# Patient Record
Sex: Female | Born: 1995 | Race: Black or African American | Hispanic: No | Marital: Single | Smoking: Never smoker
Health system: Southern US, Community
[De-identification: ages and names within clinical notes are randomized; demographics above are authoritative.]

## PROBLEM LIST (undated history)

## (undated) DIAGNOSIS — E669 Obesity, unspecified: Secondary | ICD-10-CM

## (undated) DIAGNOSIS — F419 Anxiety disorder, unspecified: Secondary | ICD-10-CM

## (undated) DIAGNOSIS — F988 Other specified behavioral and emotional disorders with onset usually occurring in childhood and adolescence: Secondary | ICD-10-CM

## (undated) HISTORY — DX: Anxiety disorder, unspecified: F41.9

## (undated) HISTORY — PX: INGUINAL HERNIA REPAIR: SUR1180

## (undated) HISTORY — DX: Obesity, unspecified: E66.9

## (undated) HISTORY — DX: Other specified behavioral and emotional disorders with onset usually occurring in childhood and adolescence: F98.8

---
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
## 2017-01-29 ENCOUNTER — Ambulatory Visit: Payer: BC Managed Care – PPO | Admitting: Family Medicine

## 2017-01-29 ENCOUNTER — Ambulatory Visit (INDEPENDENT_AMBULATORY_CARE_PROVIDER_SITE_OTHER): Payer: BC Managed Care – PPO | Admitting: Family Medicine

## 2017-01-29 ENCOUNTER — Encounter: Payer: Self-pay | Admitting: Family Medicine

## 2017-01-29 VITALS — BP 120/80 | HR 68 | Temp 98.5°F | Wt 212.2 lb

## 2017-01-29 DIAGNOSIS — M25511 Pain in right shoulder: Secondary | ICD-10-CM
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
## 2017-01-29 NOTE — Patient Instructions (Signed)
Continue taking naproxen twice daily with food. Use ice or heat. Rest your shoulder. Follow up with the orthopedist as discussed.

## 2017-07-22 ENCOUNTER — Encounter: Payer: Self-pay | Admitting: Family Medicine

## 2017-07-22 ENCOUNTER — Ambulatory Visit
Admission: RE | Admit: 2017-07-22 | Discharge: 2017-07-22 | Disposition: A | Discharge status: Home / Self Care | Payer: BC Managed Care – PPO | Source: Ambulatory Visit | Attending: Family Medicine | Admitting: Family Medicine

## 2017-07-22 ENCOUNTER — Ambulatory Visit: Payer: BC Managed Care – PPO | Admitting: Family Medicine

## 2017-07-22 VITALS — BP 120/70 | HR 86 | Ht 63.25 in | Wt 216.2 lb

## 2017-07-22 DIAGNOSIS — M79672 Pain in left foot: Secondary | ICD-10-CM | POA: Diagnosis not present

## 2017-07-22 DIAGNOSIS — M25572 Pain in left ankle and joints of left foot: Secondary | ICD-10-CM

## 2017-07-22 NOTE — Patient Instructions (Signed)
We will call you with your XR results. Take Ibuprofen 600 mg every 8 hours for the next 3-4 days, ice or heat your ankle and elevate as possible. You may use an ace wrap for compression if this improves your pain.     RICE for Routine Care of Injuries Many injuries can be cared for using rest, ice, compression, and elevation (RICE therapy). Using RICE therapy can help to lessen pain and swelling. It can help your body to heal. Rest Reduce your normal activities and avoid using the injured part of your body. You can go back to your normal activities when you feel okay and your doctor says it is okay. Ice Do not put ice on your bare skin.  Put ice in a plastic bag.  Place a towel between your skin and the bag.  Leave the ice on for 20 minutes, 2-3 times a day.  Do this for as long as told by your doctor. Compression Compression means putting pressure on the injured area. This can be done with an elastic bandage. If an elastic bandage has been applied:  Remove and reapply the bandage every 3-4 hours or as told by your doctor.  Make sure the bandage is not wrapped too tight. Wrap the bandage more loosely if part of your body beyond the bandage is blue, swollen, cold, painful, or loses feeling (numb).  See your doctor if the bandage seems to make your problems worse.  Elevation Elevation means keeping the injured area raised. Raise the injured area above your heart or the center of your chest if you can. When should I get help? You should get help if:  You keep having pain and swelling.  Your symptoms get worse.  Get help right away if: You should get help right away if:  You have sudden bad pain at or below the area of your injury.  You have redness or more swelling around your injury.  You have tingling or numbness at or below the injury that does not go away when you take off the bandage.  This information is not intended to replace advice given to you by your health care  provider. Make sure you discuss any questions you have with your health care provider. Document Released: 09/09/2007 Document Revised: 02/18/2016 Document Reviewed: 02/28/2014 Elsevier Interactive Patient Education  2017 [employer]

## 2017-07-22 NOTE — Progress Notes (Signed)
   Subjective:    Patient ID: Aimee Duncan, female    DOB: [birth year], 22 y.o.   MRN: [id number]  HPI Chief Complaint  Patient presents with  . left foot pain    left foot pain. about a week ago. walking when she hurt it   She is here with complaints of a 9 day history of left ankle pain that radiates down into the lateral aspect of her foot and occasionally up into her left lower leg. States she has been walking more at school and had to recently walk up a long hill but denies injury.   Pain improves with rest. Pain is worse with ambulation. Has taken ibuprofen sporadically. No ice or heat. Swelling improves with elevation.  No numbness, tingling or weakness. Denies fever, chills, N/V/D, no other arthralgias or myalgias.   Reviewed allergies, medications, past medical, surgical, family, and social history.   Review of Systems Pertinent positives and negatives in the history of present illness.     Objective:   Physical Exam BP 120/70   Pulse 86   Ht 5' 3.25" (1.607 m)   Wt 216 lb 3.2 oz (98.1 kg)   BMI 38.00 kg/m  Mild edema to her lateral left ankle. TTP around lateral malleolus but not directly over the malleoli. Dorsal aspect of left foot sightly TTP. Normal sensation, cap refill and motor function. Skin warm and dry. Slightly antalgic gait.        Assessment & Plan:  Acute left ankle pain - Plan: DG Ankle Complete Left  Left foot pain - Plan: DG Foot Complete Left  Will send for XR and have her try RICE. She may take ibuprofen. Follow up pending XR.

## 2019-01-05 ENCOUNTER — Ambulatory Visit: Payer: BC Managed Care – PPO | Admitting: Medical

## 2019-01-05 ENCOUNTER — Encounter: Payer: Self-pay | Admitting: Medical

## 2019-01-05 ENCOUNTER — Other Ambulatory Visit: Payer: Self-pay

## 2019-01-05 VITALS — BP 110/72 | HR 88 | Temp 98.4°F | Ht 64.0 in | Wt 220.2 lb

## 2019-01-05 DIAGNOSIS — Z23 Encounter for immunization: Secondary | ICD-10-CM | POA: Diagnosis not present

## 2019-01-05 DIAGNOSIS — L309 Dermatitis, unspecified: Secondary | ICD-10-CM | POA: Diagnosis not present

## 2019-01-05 HISTORY — DX: Encounter for immunization: Z23

## 2019-01-05 MED ORDER — HYDROCORTISONE VALERATE 0.2 % EX OINT: 1.0000 "application " | TOPICAL_OINTMENT | Freq: Two times a day (BID) | CUTANEOUS | 1 refills | Status: AC

## 2019-01-05 MED ORDER — FEXOFENADINE HCL 180 MG PO TABS: 180.0000 mg | ORAL_TABLET | Freq: Every day | ORAL | 3 refills | Status: DC

## 2019-01-05 MED ORDER — CETAPHIL MOISTURIZING EX LOTN: 1.0000 "application " | TOPICAL_LOTION | CUTANEOUS | 11 refills | Status: DC | PRN

## 2019-01-05 NOTE — Progress Notes (Signed)
Subjective: Chief Complaint  Patient presents with  . Eczema    chest area    Here for eczema concern.  Been using a few different lotions and recently cortisone cream OTC for eczema flare.  Gets eczema on arms, chest, and is flared up currently x a month.  Has been under more stress of late, thinks this is contributing as well.  Has used steroid cream in the past by me that helped.  Not using daily lotion.  No other aggravating or relieving factors. No other complaint.  Past Medical History:  Diagnosis Date  . Obesity   . Premature birth    born to mother with eclampsia, born [redacted] wk gestation   Current Outpatient Medications on File Prior to Visit  Medication Sig Dispense Refill  . SPRINTEC 28 0.25-35 MG-MCG tablet Take 1 tablet by mouth daily.  2   No current facility-administered medications on file prior to visit.    Objective: BP 110/72   Pulse 88   Temp 98.4 F (36.9 C)   Ht 5\' 4"  (1.626 m)   Wt 220 lb 3.2 oz (99.9 kg)   SpO2 98%   BMI 37.80 kg/m   Gen: wd, wn, nad Numerous rough brown patches on chest, forearms, antecubital regions and dorsal hands Exam chaperoned by nurse      Assessment: Encounter Diagnoses  Name Primary?  . Eczema, unspecified type Yes  . Need for influenza vaccination       Plan Eczema   For flareup such as current flareup, use the Hydrocortisone prescription ointment for the next 1- 2 weeks.   As symptoms improve, then go back to daily preventative treatment  Daily prevention of flares, use the following:  Take Allegra allergy pill daily at bedtime  Use Cetaphil moisturizing lotion daily  Avoid really hot showers   Eat a healthy low fat, low sugar diet  Exercise regularly  Avoid high sugar/junk foods  If this doesn't seem to be helping in the next 30 days, recheck   Counseled on the influenza virus vaccine.  Vaccine information sheet given.  Influenza vaccine given after consent obtained.    Aimee Duncan was seen today  for eczema.  Diagnoses and all orders for this visit:  Eczema, unspecified type  Need for influenza vaccination  Other orders -     hydrocortisone valerate ointment (WESTCORT) 0.2 %; Apply 1 application topically 2 (two) times daily. -     cetaphil (CETAPHIL) lotion; Apply 1 application topically as needed for dry skin. -     fexofenadine (ALLEGRA ALLERGY) 180 MG tablet; Take 1 tablet (180 mg total) by mouth daily.

## 2019-01-05 NOTE — Patient Instructions (Signed)
Eczema   For flareup such as current flareup, use the Hydrocortisone prescription ointment for the next 1- 2 weeks.   As symptoms improve, then go back to daily preventative treatment  Daily prevention of flares, use the following:  Take Allegra allergy pill daily at bedtime  Use Cetaphil moisturizing lotion daily  Avoid really hot showers   Eat a healthy low fat, low sugar diet  Exercise regularly  Avoid high sugar/junk foods  If this doesn't seem to be helping in the next 30 days, recheck     Eczema Eczema is a broad term for a group of skin conditions that cause skin to become rough and inflamed. Each type of eczema has different triggers, symptoms, and treatments. Eczema of any type is usually itchy and symptoms range from mild to severe. Eczema and its symptoms are not spread from person to person (are not contagious). It can appear on different parts of the body at different times. Your eczema may not look the same as someone else's eczema. What are the types of eczema? Atopic dermatitis This is a long-term (chronic) skin disease that keeps coming back (recurring). Usual symptoms are dry skin and small, solid pimples that may swell and leak fluid (weep). Contact dermatitis  This happens when something irritates the skin and causes a rash. The irritation can come from substances that you are allergic to (allergens), such as poison ivy, chemicals, or medicines that were applied to your skin. Dyshidrotic eczema This is a form of eczema on the hands and feet. It shows up as very itchy, fluid-filled blisters. It can affect people of any age, but is more common before age 81. Hand eczema  This causes very itchy areas of skin on the palms and sides of the hands and fingers. This type of eczema is common in industrial jobs where you may be exposed to many different types of irritants. Lichen simplex chronicus This type of eczema occurs when a person constantly scratches one area  of the body. Repeated scratching of the area leads to thickened skin (lichenification). Lichen simplex chronicus can occur along with other types of eczema. It is more common in adults, but may be seen in children as well. Nummular eczema This is a common type of eczema. It has no known cause. It typically causes a red, circular, crusty lesion (plaque) that may be itchy. Scratching may become a habit and can cause bleeding. Nummular eczema occurs most often in people of middle-age or older. It most often affects the hands. Seborrheic dermatitis This is a common skin disease that mainly affects the scalp. It may also affect any oily areas of the body, such as the face, sides of nose, eyebrows, ears, eyelids, and chest. It is marked by small scaling and redness of the skin (erythema). This can affect people of all ages. In infants, this condition is known as Location manager." Stasis dermatitis This is a common skin disease that usually appears on the legs and feet. It most often occurs in people who have a condition that prevents blood from being pumped through the veins in the legs (chronic venous insufficiency). Stasis dermatitis is a chronic condition that needs long-term management. How is eczema diagnosed? Your health care provider will examine your skin and review your medical history. He or she may also give you skin patch tests. These tests involve taking patches that contain possible allergens and placing them on your back. He or she will then check in a few days to  see if an allergic reaction occurred. What are the common treatments? Treatment for eczema is based on the type of eczema you have. Hydrocortisone steroid medicine can relieve itching quickly and help reduce inflammation. This medicine may be prescribed or obtained over-the-counter, depending on the strength of the medicine that is needed. Follow these instructions at home:  Take over-the-counter and prescription medicines only as told by  your health care provider.  Use creams or ointments to moisturize your skin. Do not use lotions.  Learn what triggers or irritates your symptoms. Avoid these things.  Treat symptom flare-ups quickly.  Do not itch your skin. This can make your rash worse.  Keep all follow-up visits as told by your health care provider. This is important. Where to find more information  The American Academy of Dermatology: http://jones-macias.info/  The National Eczema Association: www.nationaleczema.org Contact a health care provider if:  You have serious itching, even with treatment.  You regularly scratch your skin until it bleeds.  Your rash looks different than usual.  Your skin is painful, swollen, or more red than usual.  You have a fever. Summary  There are eight general types of eczema. Each type has different triggers.  Eczema of any type causes itching that may range from mild to severe.  Treatment varies based on the type of eczema you have. Hydrocortisone steroid medicine can help with itching and inflammation.  Protecting your skin is the best way to prevent eczema. Use moisturizers and lotions. Avoid triggers and irritants, and treat flare-ups quickly. This information is not intended to replace advice given to you by your health care provider. Make sure you discuss any questions you have with your health care provider. Document Released: 08/06/2016 Document Revised: 03/05/2017 Document Reviewed: 08/06/2016 Elsevier Patient Education  2020 [employer].

## 2019-01-06 NOTE — Addendum Note (Signed)
Addended by: Edgar Frisk on: 01/06/2019 12:46 PM   Modules accepted: Orders

## 2019-04-17 ENCOUNTER — Ambulatory Visit: Payer: BC Managed Care – PPO | Admitting: Medical

## 2019-04-17 ENCOUNTER — Other Ambulatory Visit: Payer: Self-pay

## 2019-04-17 ENCOUNTER — Encounter: Payer: Self-pay | Admitting: Medical

## 2019-04-17 VITALS — Temp 97.9°F | Ht 64.0 in | Wt 216.0 lb

## 2019-04-17 DIAGNOSIS — H9202 Otalgia, left ear: Secondary | ICD-10-CM | POA: Diagnosis not present

## 2019-04-17 DIAGNOSIS — R0989 Other specified symptoms and signs involving the circulatory and respiratory systems: Secondary | ICD-10-CM

## 2019-04-17 DIAGNOSIS — R05 Cough: Secondary | ICD-10-CM | POA: Diagnosis not present

## 2019-04-17 DIAGNOSIS — R059 Cough, unspecified: Secondary | ICD-10-CM

## 2019-04-17 NOTE — Patient Instructions (Signed)
Your symptoms today suggest mild respiratory tract infection.  I cannot completely rule out Covid infection.  Thus I do recommend you get tested for the sake of your fellow employees and family.  Covid testing:  [facility] testing: Schedule your test at one of the [facility] testing sites online or by text:  Online: Go to NicTax.com.pt   or   ToolingNews.es, and schedule appointment or review other testing information on the web page  Text: Text the word "COVID" to [phone]   Continue to rest, hydrate well with water and clear fluids, you can continue DayQuil and NyQuil combination, pain relievers as needed.  Of note, Covid  symptoms can include fever, tiredness, body aches, cough, sore throat, diarrhea, headache, loss of taste or smell, shortness of breath, rash, and discoloration of fingers or toes.    Risk factors that may put someone at risk for worse outcome with covid infection includes older than 22 years old, underlying health conditions like COPD, heart failure, asthma, diabetes, hypertension, kidney disease, obesity, and history of heart disease or stroke.  Currently your symptoms seem mild.     General recommendations: I recommend you rest, hydrate well with water and clear fluids throughout the day.   Drink enough water and clear fluids so that your urine is clear.   Consider using over the counter Emergen-C Immune + over the counter for the next week.  If you need any medications from the pharmacy, have a friend or family member pick them up from the pharmacy for you.  Have them drop off the medications at your home to keep you from having to go into the pharmacy and potentially exposure others.   If this is not possible, see if your pharmacy does home delivery.  Or worse case scenario, go through the drive through at your pharmacy, wear your mask, use hand sanitizer before touching anything in the drive through transaction, and limit interaction  with the store personally, particularly staying > 6 feet apart.  Over the next few days if you are having worse trouble breathing, if you are very weak, have persistent fever 101 or higher consistently despite Tylenol, uncontrollable nausea and vomiting, or feel very dehydrated, then call or go to the emergency department.    If you have other questions or have other symptoms or questions you are concerned about then please make a virtual visit with your primary care provider.  Covid symptoms such as fatigue and cough can linger over 2 weeks, even after the initial fever, aches, chills, and other initial symptoms.     Self Quarantine: The CDC, Centers for Disease Control has recommended a self quarantine of 10 -14 days from the start of your illness until you are symptom-free including at least 24 hours of no symptoms including no fever, no shortness of breath, and no body aches and chills, by day 10 before returning to work or general contact with the public.  What does self quarantine mean: avoiding contact with people as much as possible.   Particularly in your house, isolate your self from others in a separate room, wear a mask when possible in the room, particularly if coughing a lot.   Have others bring food, water, medications, etc., to your door, but avoid direct contact with your household contacts during this time to avoid spreading the infection to them.   If you have a separate bathroom and living quarters during the next 2 weeks away from others, that would be preferable.  If you can't completely isolate, then wear a mask, wash hands frequently with soap and water for at least 15 seconds, minimize close contact with others, and have a friend or family member check regularly from a distance to make sure you are not getting seriously worse.     You should not be going out in public, should not be going to stores, to work or other public places until all your symptoms have resolved and at  least 10 days + 24 hours of no symptoms at all have transpired.   Ideally you should avoid contact with others for a full 10 days if possible.  One of the goals is to limit spread to high risk people; people that are older and elderly, people with multiple health issues like diabetes, heart disease, lung disease, and anybody that has weakened immune systems such as people with cancer or on immunosuppressive therapy.

## 2019-04-17 NOTE — Progress Notes (Signed)
Subjective:     Patient ID: Aimee Duncan, female   DOB: 1995-10-16, 24 y.o.   MRN: [id number]  This visit type was conducted due to national recommendations for restrictions regarding the COVID-19 Pandemic (e.g. social distancing) in an effort to limit this patient's exposure and mitigate transmission in our community.  Due to their co-morbid illnesses, this patient is at least at moderate risk for complications without adequate follow up.  This format is felt to be most appropriate for this patient at this time.    Documentation for virtual audio and video telecommunications through Zoom encounter:  The patient was located at home. The provider was located in the office. The patient did consent to this visit and is aware of possible charges through their insurance for this visit.  The other persons participating in this telemedicine service were none. Time spent on call was 20 minutes and in review of previous records 20 minutes total.  This virtual service is not related to other E/M service within previous 7 days.   HPI Chief Complaint  Patient presents with  . Sinus Problem    runny nose, ear pain  . Cough   Virtual consult today for new symptoms.  3 days ago she started having some left ear pain.  Took some ibuprofen.  The next day she has some cough and runny nose, feeling tired.  Her ear pain is better today.  Her symptoms are mild.  She is using DayQuil and NyQuil, hot tea, cough drops.  No ear drainage.   No swimming.   She denies fever, no nausea, no vomiting, no diarrhea, no sob, no sore throat, no rash.  No sick contacts.  She works at [employer].   She notes in the past having ear infection this time of year a year ago and strep throat year before in the winter.  But no frequent strep or ear infections.  Past Medical History:  Diagnosis Date  . Obesity   . Premature birth    born to mother with eclampsia, born [redacted] wk gestation   Current Outpatient Medications on File  Prior to Visit  Medication Sig Dispense Refill  . cetaphil (CETAPHIL) lotion Apply 1 application topically as needed for dry skin. 236 mL 11  . fexofenadine (ALLEGRA ALLERGY) 180 MG tablet Take 1 tablet (180 mg total) by mouth daily. 90 tablet 3  . hydrocortisone valerate ointment (WESTCORT) 0.2 % Apply 1 application topically 2 (two) times daily. 60 g 1  . SPRINTEC 28 0.25-35 MG-MCG tablet Take 1 tablet by mouth daily.  2   No current facility-administered medications on file prior to visit.    Review of Systems As in subjective    Objective:   Physical Exam Due to coronavirus pandemic stay at home measures, patient visit was virtual and they were not examined in person.   Temp 97.9 F (36.6 C)   Ht 5\' 4"  (1.626 m)   Wt 216 lb (98 kg)   BMI 37.08 kg/m       Assessment:     Encounter Diagnoses  Name Primary?  . Otalgia of left ear Yes  . Cough   . Runny nose        Plan:     Your symptoms today suggest mild respiratory tract infection.  I cannot completely rule out Covid infection.  Thus I do recommend you get tested for the sake of your fellow employees and family.  Covid testing:  [place] testing: Schedule your  test at one of the [facility] testing sites online or by text:  Online: Go to NicTax.com.pt   or   ToolingNews.es, and schedule appointment or review other testing information on the web page  Text: Text the word "COVID" to [phone]   Continue to rest, hydrate well with water and clear fluids, you can continue DayQuil and NyQuil combination, pain relievers as needed.  Of note, Covid  symptoms can include fever, tiredness, body aches, cough, sore throat, diarrhea, headache, loss of taste or smell, shortness of breath, rash, and discoloration of fingers or toes.    Risk factors that may put someone at risk for worse outcome with covid infection includes older than 22 years old, underlying health conditions like COPD,  heart failure, asthma, diabetes, hypertension, kidney disease, obesity, and history of heart disease or stroke.  Currently your symptoms seem mild.     General recommendations: I recommend you rest, hydrate well with water and clear fluids throughout the day.   Drink enough water and clear fluids so that your urine is clear.   Consider using over the counter Emergen-C Immune + over the counter for the next week.  If you need any medications from the pharmacy, have a friend or family member pick them up from the pharmacy for you.  Have them drop off the medications at your home to keep you from having to go into the pharmacy and potentially exposure others.   If this is not possible, see if your pharmacy does home delivery.  Or worse case scenario, go through the drive through at your pharmacy, wear your mask, use hand sanitizer before touching anything in the drive through transaction, and limit interaction with the store personally, particularly staying > 6 feet apart.  Over the next few days if you are having worse trouble breathing, if you are very weak, have persistent fever 101 or higher consistently despite Tylenol, uncontrollable nausea and vomiting, or feel very dehydrated, then call or go to the emergency department.    If you have other questions or have other symptoms or questions you are concerned about then please make a virtual visit with your primary care provider.  Covid symptoms such as fatigue and cough can linger over 2 weeks, even after the initial fever, aches, chills, and other initial symptoms.     Self Quarantine: The CDC, Centers for Disease Control has recommended a self quarantine of 10 -14 days from the start of your illness until you are symptom-free including at least 24 hours of no symptoms including no fever, no shortness of breath, and no body aches and chills, by day 10 before returning to work or general contact with the public.  What does self quarantine  mean: avoiding contact with people as much as possible.   Particularly in your house, isolate your self from others in a separate room, wear a mask when possible in the room, particularly if coughing a lot.   Have others bring food, water, medications, etc., to your door, but avoid direct contact with your household contacts during this time to avoid spreading the infection to them.   If you have a separate bathroom and living quarters during the next 2 weeks away from others, that would be preferable.    If you can't completely isolate, then wear a mask, wash hands frequently with soap and water for at least 15 seconds, minimize close contact with others, and have a friend or family member check regularly from a distance to  make sure you are not getting seriously worse.     You should not be going out in public, should not be going to stores, to work or other public places until all your symptoms have resolved and at least 10 days + 24 hours of no symptoms at all have transpired.   Ideally you should avoid contact with others for a full 10 days if possible.  One of the goals is to limit spread to high risk people; people that are older and elderly, people with multiple health issues like diabetes, heart disease, lung disease, and anybody that has weakened immune systems such as people with cancer or on immunosuppressive therapy.      Nicky was seen today for sinus problem and cough.  Diagnoses and all orders for this visit:  Otalgia of left ear -     Novel Coronavirus, NAA ([facility])  Cough -     Novel Coronavirus, NAA ([facility])  Runny nose -     Novel Coronavirus, NAA ([facility])

## 2019-04-18 ENCOUNTER — Encounter: Payer: Self-pay | Admitting: Medical

## 2019-04-18 NOTE — Progress Notes (Signed)
done

## 2020-08-22 ENCOUNTER — Ambulatory Visit: Payer: BC Managed Care – PPO | Admitting: Medical

## 2020-08-22 ENCOUNTER — Other Ambulatory Visit: Payer: Self-pay

## 2020-08-22 ENCOUNTER — Encounter: Payer: Self-pay | Admitting: Medical

## 2020-08-22 VITALS — BP 114/78 | HR 69 | Ht 64.0 in | Wt 227.4 lb

## 2020-08-22 DIAGNOSIS — F909 Attention-deficit hyperactivity disorder, unspecified type: Secondary | ICD-10-CM

## 2020-08-22 DIAGNOSIS — Z79899 Other long term (current) drug therapy: Secondary | ICD-10-CM | POA: Diagnosis not present

## 2020-08-22 DIAGNOSIS — Z136 Encounter for screening for cardiovascular disorders: Secondary | ICD-10-CM | POA: Insufficient documentation

## 2020-08-22 MED ORDER — AMPHETAMINE-DEXTROAMPHET ER 15 MG PO CP24: 15.0000 mg | ORAL_CAPSULE | ORAL | 0 refills | Status: DC

## 2020-08-22 NOTE — Progress Notes (Signed)
Subjective:  Aimee Duncan is a 25 y.o. female who presents for Chief Complaint  Patient presents with  . ADHD     Here for concern for ADHD.    Grandmother Aimee Duncan is with her today to help with history    She notes 25yo was started on medication for focus and attention.  She notes at that time was having headaches initially with the medication.  After a few months of medication, her mother didn't want her on medication so she never continued this.  She notes having a former evaluation with Dr. Brain Duncan here in [place].   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   Did 2 years at [employer] prior.   Transferring to  [employer] in the fall.  Took a gap year.  She notes grades freshman year were not good.  Has trouble with focus, staying on task.  Teachers have been concerned about her focus and attention.  She is fidgety, hard to stay focused on homework.  Sometimes gets sad if she realizes she is behind on tasks.    Exercise - sometimes with roller blading.  Currently working 10 hours per week.     No alcohol.   No energy drinks.   Sometimes drinks fruit punch.    Sleep is hit or miss, sometimes fine ,sometimes trouble falling asleep.    No other aggravating or relieving factors.    No other c/o.  Past Medical History:  Diagnosis Date  . Obesity   . Premature birth    born to mother with eclampsia, born [redacted] wk gestation   Current Outpatient Medications on File Prior to Visit  Medication Sig Dispense Refill  . cetaphil (CETAPHIL) lotion Apply 1 application topically as needed for dry skin. (Patient not taking: Reported on 08/22/2020) 236 mL 11  . fexofenadine (ALLEGRA ALLERGY) 180 MG tablet Take 1 tablet (180 mg total) by mouth daily. (Patient not taking: Reported on 08/22/2020) 90 tablet 3  . hydrocortisone valerate ointment (WESTCORT) 0.2 % Apply 1 application topically 2 (two) times daily. (Patient not taking: Reported on 08/22/2020) 60 g 1  . SPRINTEC 28 0.25-35  MG-MCG tablet Take 1 tablet by mouth daily. (Patient not taking: Reported on 08/22/2020)  2   No current facility-administered medications on file prior to visit.   Family History  Problem Relation Age of Onset  . Diabetes Mother   . Hypertension Father   . Sleep apnea Father   . Heart disease Maternal Grandfather        died of MI at age 58yo   The following portions of the patient's history were reviewed and updated as appropriate: allergies, current medications, past family history, past medical history, past social history, past surgical history and problem list.  ROS Otherwise as in subjective above    Objective: BP 114/78   Pulse 69   Ht 5\' 4"  (1.626 m)   Wt 227 lb 6.4 oz (103.1 kg)   SpO2 95%   BMI 39.03 kg/m   General appearance: alert, no distress, well developed, well nourished Neck: supple, no lymphadenopathy, no thyromegaly, no masses Heart: RRR, normal S1, S2, no murmurs Lungs: CTA bilaterally, no wheezes, rhonchi, or rales Pulses: 2+ radial pulses, 2+ pedal pulses, normal cap refill Ext: no edema Psych:   EKG Indication screen for heart disease, high risk medication, rate 60 bpm, PR 152 ms, QRS 84 ms, QTC 392 ms, axis 74 degrees, nsr with sinus arrhthymias    Assessment: Encounter Diagnoses  Name Primary?  . Attention deficit hyperactivity disorder (ADHD), unspecified ADHD type Yes  . Screening for heart disease      Plan: I reviewed her ADHD screening which was positive.  We discussed her symptoms, prior brief eval and treatment for ADHD at age 28.  She has had problems all along including her freshman year of college.  She would like to restart some treatment.  We discussed the importance of exercise, good sleep habits, sleep hygiene, discussed nutrition, avoiding caffeine and high sugar foods.  We discussed study skills and organization.  Advise she meet with her advisor and professors and work with down to keep her organized  Begin trial of  Adderall.  Discussed risk and benefits and proper use of medication.  Discussed medication safekeeping.  EKG reviewed today  Follow-up in 1 month  Aimee Duncan was seen today for adhd.  Diagnoses and all orders for this visit:  Attention deficit hyperactivity disorder (ADHD), unspecified ADHD type -     EKG 12-Lead  Screening for heart disease  Other orders -     amphetamine-dextroamphetamine (ADDERALL XR) 15 MG 24 hr capsule; Take 1 capsule by mouth every morning.  Spent > 45 minutes face to face with patient in discussion of symptoms, evaluation, plan and recommendations.    Follow up: 52mo

## 2020-09-09 ENCOUNTER — Ambulatory Visit: Payer: BC Managed Care – PPO | Admitting: Medical

## 2020-09-09 ENCOUNTER — Other Ambulatory Visit: Payer: Self-pay

## 2020-09-09 ENCOUNTER — Encounter: Payer: Self-pay | Admitting: Medical

## 2020-09-09 VITALS — BP 110/70 | HR 98 | Ht 64.0 in | Wt 230.0 lb

## 2020-09-09 DIAGNOSIS — T50905A Adverse effect of unspecified drugs, medicaments and biological substances, initial encounter: Secondary | ICD-10-CM | POA: Insufficient documentation

## 2020-09-09 DIAGNOSIS — R112 Nausea with vomiting, unspecified: Secondary | ICD-10-CM | POA: Insufficient documentation

## 2020-09-09 DIAGNOSIS — S82899D Other fracture of unspecified lower leg, subsequent encounter for closed fracture with routine healing: Secondary | ICD-10-CM | POA: Insufficient documentation

## 2020-09-09 DIAGNOSIS — R41840 Attention and concentration deficit: Secondary | ICD-10-CM | POA: Diagnosis not present

## 2020-09-09 DIAGNOSIS — Z79899 Other long term (current) drug therapy: Secondary | ICD-10-CM

## 2020-09-09 DIAGNOSIS — F909 Attention-deficit hyperactivity disorder, unspecified type: Secondary | ICD-10-CM

## 2020-09-09 DIAGNOSIS — R4586 Emotional lability: Secondary | ICD-10-CM | POA: Insufficient documentation

## 2020-09-09 DIAGNOSIS — S82891D Other fracture of right lower leg, subsequent encounter for closed fracture with routine healing: Secondary | ICD-10-CM

## 2020-09-09 MED ORDER — METHYLPHENIDATE HCL 10 MG PO TABS: 10.0000 mg | ORAL_TABLET | Freq: Two times a day (BID) | ORAL | 0 refills | Status: DC

## 2020-09-09 NOTE — Progress Notes (Signed)
Subjective:  Aimee Duncan is a 25 y.o. female who presents for Chief Complaint  Patient presents with  . Medication Problem    Medication causing long periods and mood changes      Here with her grandmother for adverse medication reaction.  I saw her recently for ADHD concerns.  We started Adderall.  She started taking this a few weeks ago.  But after 3 days she had new side affects to the point where she stopped taking the medicine after 3 days.  She had no problem on day 1 but by day 2 she had changes in her mood, felt depressed and sad, and vomited a few times, did not feel right.  By day 3 the symptoms continued including crying spells.  So she decided to stop the medication.  She has only ever been on one other attention deficit medicine when she was about 25 years old that caused headaches.  She would like to try different medication  She saw urgent care over the weekend.  This past weekend on June 4 she was playing some sports and turned unusually causing injury to the right ankle.  She was seen by urgent care and has a reported avulsion fracture.  She was placed on a crutch and put in a boot walker device.  She has follow-up pending with orthopedics  No other aggravating or relieving factors.    No other c/o.  The following portions of the patient's history were reviewed and updated as appropriate: allergies, current medications, past family history, past medical history, past social history, past surgical history and problem list.  ROS Otherwise as in subjective above  Objective: BP 110/70   Pulse 98   Ht 5\' 4"  (1.626 m)   Wt 230 lb (104.3 kg)   SpO2 97%   BMI 39.48 kg/m   General appearance: alert, no distress, well developed, well nourished Seated, crutches beside her, right foot in a lower leg boot/orthotic    Assessment: Encounter Diagnoses  Name Primary?  . Attention and concentration deficit Yes  . Attention deficit hyperactivity disorder (ADHD), unspecified ADHD  type   . High risk medication use   . Mood change   . Nausea and vomiting, intractability of vomiting not specified, unspecified vomiting type   . Adverse effect of drug, initial encounter   . Closed avulsion fracture of right ankle with routine healing, subsequent encounter      Plan: ADHD, high risk medication-given the recent adverse reactions including mood change, crying spells and vomiting we will stop Adderall.  Changed to methylphenidate as below.  We discussed proper use of medication, risk and benefits of medication.  She will let me know if any adverse effect right away.  If no adverse effects we will plan to recheck in 3 to 4 weeks   I reviewed the recent urgent care notes from her ankle injury including notation of avulsion fracture of right ankle.  Advise she continue crutches, orthotic boot and follow-up with orthopedic as planned   Aimee Duncan was seen today for medication problem.  Diagnoses and all orders for this visit:  Attention and concentration deficit  Attention deficit hyperactivity disorder (ADHD), unspecified ADHD type  High risk medication use  Mood change  Nausea and vomiting, intractability of vomiting not specified, unspecified vomiting type  Adverse effect of drug, initial encounter  Closed avulsion fracture of right ankle with routine healing, subsequent encounter  Other orders -     methylphenidate (RITALIN) 10 MG tablet; Take  1 tablet (10 mg total) by mouth 2 (two) times daily.    Follow up: 1 month

## 2020-09-18 ENCOUNTER — Ambulatory Visit: Payer: BC Managed Care – PPO | Admitting: Medical

## 2020-10-15 ENCOUNTER — Ambulatory Visit: Payer: BC Managed Care – PPO | Admitting: Medical

## 2020-10-17 ENCOUNTER — Ambulatory Visit: Payer: BC Managed Care – PPO | Admitting: Medical

## 2020-10-17 ENCOUNTER — Encounter: Payer: Self-pay | Admitting: Medical

## 2020-10-17 ENCOUNTER — Other Ambulatory Visit: Payer: Self-pay

## 2020-10-17 VITALS — BP 130/82 | HR 89 | Temp 97.4°F | Wt 227.4 lb

## 2020-10-17 DIAGNOSIS — F909 Attention-deficit hyperactivity disorder, unspecified type: Secondary | ICD-10-CM | POA: Diagnosis not present

## 2020-10-17 DIAGNOSIS — Z79899 Other long term (current) drug therapy: Secondary | ICD-10-CM | POA: Diagnosis not present

## 2020-10-17 MED ORDER — METHYLPHENIDATE HCL ER (LA) 10 MG PO CP24: 10.0000 mg | ORAL_CAPSULE | Freq: Every day | ORAL | 0 refills | Status: DC

## 2020-10-17 NOTE — Progress Notes (Signed)
Subjective:  Aimee Duncan is a 25 y.o. female who presents for Chief Complaint  Patient presents with   Follow-up    Has had improvement with new med.      Here for f/u on ADHD medication.   Last visit she has side effects with adderall couldn't tolerate this.  Since last visit she has been using trial of Ritalin BID and doing well on this.   Has seen a big difference in completing tasks, staying focused.   No reported side effects on this medication.  Big difference compared to the adderall.     On adderall trial she noted changes in mood, felt depressed and sad, and vomited a few times, did not feel right.    Her current job is Engineer, mining at [employer].  Been at this job x 1 month  No other aggravating or relieving factors.    No other c/o.  The following portions of the patient's history were reviewed and updated as appropriate: allergies, current medications, past family history, past medical history, past social history, past surgical history and problem list.  ROS Otherwise as in subjective above     Objective: BP 130/82   Pulse 89   Temp (!) 97.4 F (36.3 C)   Wt 227 lb 6.4 oz (103.1 kg)   LMP 09/23/2020   SpO2 98%   BMI 39.03 kg/m   General appearance: alert, no distress, well developed, well nourished    Assessment: Encounter Diagnoses  Name Primary?   Attention deficit hyperactivity disorder (ADHD), unspecified ADHD type Yes   High risk medication use       Plan: ADHD, high risk medication- doing great on methylphenidate.  Change to LA version.  We discussed proper use of medication, risk and benefits of medication.  She will let me know if any adverse effect right away.  If no adverse effects we will plan to recheck in 3 months for fasting physical   Aimee Duncan was seen today for follow-up.  Diagnoses and all orders for this visit:  Attention deficit hyperactivity disorder (ADHD), unspecified ADHD type  High risk medication use  Other orders -      methylphenidate (RITALIN LA) 10 MG 24 hr capsule; Take 1 capsule (10 mg total) by mouth daily.    Follow up: 3 months for fasting physical

## 2021-01-08 ENCOUNTER — Other Ambulatory Visit: Payer: Self-pay | Admitting: Medical

## 2021-01-08 ENCOUNTER — Telehealth: Payer: Self-pay | Admitting: Medical

## 2021-01-08 MED ORDER — METHYLPHENIDATE HCL ER (LA) 10 MG PO CP24: 10.0000 mg | ORAL_CAPSULE | Freq: Every day | ORAL | 0 refills | Status: DC

## 2021-01-08 NOTE — Telephone Encounter (Signed)
Pt called for refill of Ritalin LA. Please send to [facility]. Pt can be reached at [phone].

## 2021-02-05 ENCOUNTER — Other Ambulatory Visit: Payer: Self-pay

## 2021-02-05 ENCOUNTER — Encounter: Payer: Self-pay | Admitting: Medical

## 2021-02-05 ENCOUNTER — Ambulatory Visit (INDEPENDENT_AMBULATORY_CARE_PROVIDER_SITE_OTHER): Payer: BC Managed Care – PPO | Admitting: Medical

## 2021-02-05 VITALS — BP 120/80 | HR 92 | Ht 64.0 in | Wt 220.2 lb

## 2021-02-05 DIAGNOSIS — Z113 Encounter for screening for infections with a predominantly sexual mode of transmission: Secondary | ICD-10-CM | POA: Diagnosis not present

## 2021-02-05 DIAGNOSIS — Z131 Encounter for screening for diabetes mellitus: Secondary | ICD-10-CM | POA: Diagnosis not present

## 2021-02-05 DIAGNOSIS — Z6837 Body mass index (BMI) 37.0-37.9, adult: Secondary | ICD-10-CM

## 2021-02-05 DIAGNOSIS — Z0000 Encounter for general adult medical examination without abnormal findings: Secondary | ICD-10-CM | POA: Diagnosis not present

## 2021-02-05 DIAGNOSIS — Z79899 Other long term (current) drug therapy: Secondary | ICD-10-CM

## 2021-02-05 DIAGNOSIS — Z23 Encounter for immunization: Secondary | ICD-10-CM

## 2021-02-05 DIAGNOSIS — Z1329 Encounter for screening for other suspected endocrine disorder: Secondary | ICD-10-CM | POA: Insufficient documentation

## 2021-02-05 DIAGNOSIS — Z6841 Body Mass Index (BMI) 40.0 and over, adult: Secondary | ICD-10-CM | POA: Insufficient documentation

## 2021-02-05 DIAGNOSIS — Z130 Encounter for screening for diseases of the blood and blood-forming organs and certain disorders involving the immune mechanism: Secondary | ICD-10-CM

## 2021-02-05 DIAGNOSIS — F909 Attention-deficit hyperactivity disorder, unspecified type: Secondary | ICD-10-CM

## 2021-02-05 HISTORY — DX: Encounter for immunization: Z23

## 2021-02-05 LAB — POCT URINE PREGNANCY: Preg Test, Ur: NEGATIVE

## 2021-02-05 MED ORDER — METHYLPHENIDATE HCL ER (LA) 10 MG PO CP24: 10.0000 mg | ORAL_CAPSULE | Freq: Every day | ORAL | 0 refills | Status: DC

## 2021-02-05 NOTE — Progress Notes (Signed)
Subjective:   HPI  Aimee Duncan is a 25 y.o. female who presents for Chief Complaint  Patient presents with   cpe    Cpe, will have obgyn for pap. Would like STD check. Would like flu shot    Patient Care Team: Enes Rokosz, Camelia Eng, PA-C as PCP - General (Family Medicine) Sees dentist Sees eye doctor OB/Gyn  Concerns: none  Gynecological history:  no pregnancies, periods are normal, no heavy.  No other issues.   Reviewed their medical, surgical, family, social, medication, and allergy history and updated chart as appropriate.  Past Medical History:  Diagnosis Date   ADD (attention deficit disorder)    Obesity    Premature birth    born to mother with eclampsia, born [redacted] wk gestation    Family History  Problem Relation Age of Onset   Diabetes Mother    Hypertension Father    Sleep apnea Father    Dementia Maternal Grandmother    Heart disease Maternal Grandfather        died of MI at age 67yo   Hypertension Paternal Grandmother      Current Outpatient Medications:    fexofenadine (ALLEGRA ALLERGY) 180 MG tablet, Take 1 tablet (180 mg total) by mouth daily., Disp: 90 tablet, Rfl: 3   hydrocortisone valerate ointment (WESTCORT) 0.2 %, Apply 1 application topically 2 (two) times daily., Disp: 60 g, Rfl: 1   chlorhexidine (PERIDEX) 0.12 % solution, SMARTSIG:By Mouth (Patient not taking: Reported on 02/05/2021), Disp: , Rfl:    methylphenidate (RITALIN LA) 10 MG 24 hr capsule, Take 1 capsule (10 mg total) by mouth daily., Disp: 90 capsule, Rfl: 0  Allergies  Allergen Reactions   Adderall [Amphetamine-Dextroamphetamine]     Nausea, crying spells, mood change      Review of Systems Constitutional: -fever, -chills, -sweats, -unexpected weight change, -decreased appetite, -fatigue Allergy: -sneezing, -itching, -congestion Dermatology: -changing moles, --rash, -lumps ENT: -runny nose, -ear pain, -sore throat, -hoarseness, -sinus pain, -teeth pain, - ringing in  ears, -hearing loss, -nosebleeds Cardiology: -chest pain, -palpitations, -swelling, -difficulty breathing when lying flat, -waking up short of breath Respiratory: -cough, -shortness of breath, -difficulty breathing with exercise or exertion, -wheezing, -coughing up blood Gastroenterology: -abdominal pain, -nausea, -vomiting, -diarrhea, -constipation, -blood in stool, -changes in bowel movement, -difficulty swallowing or eating Hematology: -bleeding, -bruising  Musculoskeletal: -joint aches, -muscle aches, -joint swelling, -back pain, -neck pain, -cramping, -changes in gait Ophthalmology: denies vision changes, eye redness, itching, discharge Urology: -burning with urination, -difficulty urinating, -blood in urine, -urinary frequency, -urgency, -incontinence Neurology: -headache, -weakness, -tingling, -numbness, -memory loss, -falls, -dizziness Psychology: -depressed mood, -agitation, -sleep problems Breast/gyn: -breast tendnerss, -discharge, -lumps, -vaginal discharge,- irregular periods, -heavy periods   Depression screen [facility] 2/9 02/05/2021 08/22/2020  Decreased Interest 0 0  Down, Depressed, Hopeless 0 0  PHQ - 2 Score 0 0       Objective:  BP 120/80   Pulse 92   Ht 5' 4"  (1.626 m)   Wt 220 lb 3.2 oz (99.9 kg)   BMI 37.80 kg/m   General appearance: alert, no distress, WD/WN, African American female Skin: unremarkable HEENT: normocephalic, conjunctiva/corneas normal, sclerae anicteric, PERRLA, EOMi Neck: supple, no lymphadenopathy, no thyromegaly, no masses, normal ROM, no bruits Chest: non tender, normal shape and expansion Heart: RRR, normal S1, S2, no murmurs Lungs: CTA bilaterally, no wheezes, rhonchi, or rales Abdomen: +bs, soft, non tender, non distended, no masses, no hepatomegaly, no splenomegaly, no bruits Back: non tender, normal ROM,  no scoliosis Musculoskeletal: upper extremities non tender, no obvious deformity, normal ROM throughout, lower extremities non tender, no  obvious deformity, normal ROM throughout Extremities: no edema, no cyanosis, no clubbing Pulses: 2+ symmetric, upper and lower extremities, normal cap refill Neurological: alert, oriented x 3, CN2-12 intact, strength normal upper extremities and lower extremities, sensation normal throughout, DTRs 2+ throughout, no cerebellar signs, gait normal Psychiatric: normal affect, behavior normal, pleasant  Breast/gyn/rectal - deferred to gynecology     Assessment and Plan :   Encounter Diagnoses  Name Primary?   Encounter for health maintenance examination in adult Yes   Screen for STD (sexually transmitted disease)    Needs flu shot    Screening for diabetes mellitus    Screening for thyroid disorder    Screening for deficiency anemia    High risk medication use    BMI 37.0-37.9, adult    Attention deficit hyperactivity disorder (ADHD), unspecified ADHD type      This visit was a preventative care visit, also known as wellness visit or routine physical.   Topics typically include healthy lifestyle, diet, exercise, preventative care, vaccinations, sick and well care, proper use of emergency dept and after hours care, as well as other concerns.     Recommendations: Continue to return yearly for your annual wellness and preventative care visits.  This gives [place] a chance to discuss healthy lifestyle, exercise, vaccinations, review your chart record, and perform screenings where appropriate.  I recommend you see your eye doctor yearly for routine vision care.  I recommend you see your dentist yearly for routine dental care including hygiene visits twice yearly.  See your gynecologist yearly for routine gynecological care.   Vaccination recommendations were reviewed Immunization History  Administered Date(s) Administered   DTaP 05/22/1998, 07/09/1998, 10/31/1998, 12/17/1999, 02/19/2003   HPV Quadrivalent 12/24/2011, 01/08/2012, 11/14/2013   Hepatitis A 08/06/2008, 12/24/2011   Hepatitis  B 05/22/1998, 07/09/1998, 10/31/1998   HiB (PRP-OMP) 05/22/1998, 07/09/1998, 12/17/1999   IPV 05/22/1998, 07/09/1998, 04/17/1999, 02/19/2003   Influenza Nasal 01/08/2012   Influenza,inj,Quad PF,6+ Mos 01/05/2019, 02/05/2021   MMR 04/17/1999, 02/19/2003   Meningococcal B, OMV 11/05/2015, 12/16/2015   Meningococcal Conjugate 11/14/2013   PFIZER(Purple Top)SARS-COV-2 Vaccination 06/16/2019, 07/07/2019, 05/30/2020   Pneumococcal Conjugate-13 10/31/1998, 04/17/1999, 12/17/1999   Tdap 10/18/2009, 11/13/2020   Counseled on the influenza virus vaccine.  Vaccine information sheet given.  Influenza vaccine given after consent obtained.  She declines Menveo #2   Screening for cancer: Colon cancer screening: Age 61  Breast cancer screening: You should perform a self breast exam monthly.   We reviewed recommendations for regular mammograms and breast cancer screening.  Cervical cancer screening: We reviewed recommendations for pap smear screening.   Skin cancer screening: Check your skin regularly for new changes, growing lesions, or other lesions of concern Come in for evaluation if you have skin lesions of concern.  Lung cancer screening: If you have a greater than 20 pack year history of tobacco use, then you may qualify for lung cancer screening with a chest CT scan.   Please call your insurance company to inquire about coverage for this test.  We currently don't have screenings for other cancers besides breast, cervical, colon, and lung cancers.  If you have a strong family history of cancer or have other cancer screening concerns, please let me know.    Bone health: Get at least 150 minutes of aerobic exercise weekly Get weight bearing exercise at least once weekly Bone density test:  A  bone density test is an imaging test that uses a type of X-ray to measure the amount of calcium and other minerals in your bones. The test may be used to diagnose or screen you for a condition that  causes weak or thin bones (osteoporosis), predict your risk for a broken bone (fracture), or determine how well your osteoporosis treatment is working. The bone density test is recommended for females 71 and older, or females or males <19 if certain risk factors such as thyroid disease, long term use of steroids such as for asthma or rheumatological issues, vitamin D deficiency, estrogen deficiency, family history of osteoporosis, self or family history of fragility fracture in first degree relative.    Heart health: Get at least 150 minutes of aerobic exercise weekly Limit alcohol It is important to maintain a healthy blood pressure and healthy cholesterol numbers  Heart disease screening: Screening for heart disease includes screening for blood pressure, fasting lipids, glucose/diabetes screening, BMI height to weight ratio, reviewed of smoking status, physical activity, and diet.    Goals include blood pressure 120/80 or less, maintaining a healthy lipid/cholesterol profile, preventing diabetes or keeping diabetes numbers under good control, not smoking or using tobacco products, exercising most days per week or at least 150 minutes per week of exercise, and eating healthy variety of fruits and vegetables, healthy oils, and avoiding unhealthy food choices like fried food, fast food, high sugar and high cholesterol foods.    Other tests may possibly include EKG test, CT coronary calcium score, echocardiogram, exercise treadmill stress test.     Medical care options: I recommend you continue to seek care here first for routine care.  We try really hard to have available appointments Monday through Friday daytime hours for sick visits, acute visits, and physicals.  Urgent care should be used for after hours and weekends for significant issues that cannot wait till the next day.  The emergency department should be used for significant potentially life-threatening emergencies.  The emergency  department is expensive, can often have long wait times for less significant concerns, so try to utilize primary care, urgent care, or telemedicine when possible to avoid unnecessary trips to the emergency department.  Virtual visits and telemedicine have been introduced since the pandemic started in 2020, and can be convenient ways to receive medical care.  We offer virtual appointments as well to assist you in a variety of options to seek medical care.    Separate significant issues discussed: ADD-doing fine on current dosing of methylphenidate  BMI greater than 37 -  work on healthy lifestyle, regular exercise, low-fat diet in efforts to lose weight  Advised she consider birth control and strict condom use   Kerianna was seen today for cpe.  Diagnoses and all orders for this visit:  Encounter for health maintenance examination in adult -     Comprehensive metabolic panel; Future -     CBC; Future -     TSH; Future -     Hemoglobin A1c; Future -     HIV Antibody (routine testing w rflx); Future -     RPR; Future -     GC/Chlamydia Probe Amp -     POCT urine pregnancy  Screen for STD (sexually transmitted disease) -     HIV Antibody (routine testing w rflx); Future -     RPR; Future -     GC/Chlamydia Probe Amp  Needs flu shot -     Flu Vaccine QUAD 49moIM (  Fluarix, Fluzone & Alfiuria Quad PF)  Screening for diabetes mellitus -     Hemoglobin A1c; Future  Screening for thyroid disorder -     TSH; Future  Screening for deficiency anemia -     CBC; Future  High risk medication use -     Comprehensive metabolic panel; Future -     CBC; Future  BMI 37.0-37.9, adult  Attention deficit hyperactivity disorder (ADHD), unspecified ADHD type  Other orders -     methylphenidate (RITALIN LA) 10 MG 24 hr capsule; Take 1 capsule (10 mg total) by mouth daily.  Follow-up pending labs, yearly for physical

## 2021-02-06 ENCOUNTER — Other Ambulatory Visit: Payer: BC Managed Care – PPO

## 2021-02-06 DIAGNOSIS — Z131 Encounter for screening for diabetes mellitus: Secondary | ICD-10-CM

## 2021-02-06 DIAGNOSIS — Z79899 Other long term (current) drug therapy: Secondary | ICD-10-CM

## 2021-02-06 DIAGNOSIS — Z130 Encounter for screening for diseases of the blood and blood-forming organs and certain disorders involving the immune mechanism: Secondary | ICD-10-CM

## 2021-02-06 DIAGNOSIS — Z1329 Encounter for screening for other suspected endocrine disorder: Secondary | ICD-10-CM

## 2021-02-06 DIAGNOSIS — Z0000 Encounter for general adult medical examination without abnormal findings: Secondary | ICD-10-CM

## 2021-02-06 DIAGNOSIS — Z113 Encounter for screening for infections with a predominantly sexual mode of transmission: Secondary | ICD-10-CM

## 2021-02-06 LAB — GC/CHLAMYDIA PROBE AMP
Chlamydia trachomatis, NAA: NEGATIVE
Neisseria Gonorrhoeae by PCR: NEGATIVE

## 2021-02-07 LAB — CBC
Hematocrit: 39.3 % (ref 34.0–46.6)
Hemoglobin: 13 g/dL (ref 11.1–15.9)
MCH: 29.5 pg (ref 26.6–33.0)
MCHC: 33.1 g/dL (ref 31.5–35.7)
MCV: 89 fL (ref 79–97)
Platelets: 345 10*3/uL (ref 150–450)
RBC: 4.41 x10E6/uL (ref 3.77–5.28)
RDW: 11.5 % — ABNORMAL LOW (ref 11.7–15.4)
WBC: 6.3 10*3/uL (ref 3.4–10.8)

## 2021-02-07 LAB — COMPREHENSIVE METABOLIC PANEL
ALT: 6 IU/L (ref 0–32)
AST: 12 IU/L (ref 0–40)
Albumin/Globulin Ratio: 1.4 (ref 1.2–2.2)
Albumin: 4.3 g/dL (ref 3.9–5.0)
Alkaline Phosphatase: 99 IU/L (ref 44–121)
BUN/Creatinine Ratio: 10 (ref 9–23)
BUN: 7 mg/dL (ref 6–20)
Bilirubin Total: 1 mg/dL (ref 0.0–1.2)
CO2: 22 mmol/L (ref 20–29)
Calcium: 9.6 mg/dL (ref 8.7–10.2)
Chloride: 103 mmol/L (ref 96–106)
Creatinine, Ser: 0.67 mg/dL (ref 0.57–1.00)
Globulin, Total: 3 g/dL (ref 1.5–4.5)
Glucose: 86 mg/dL (ref 70–99)
Potassium: 4.3 mmol/L (ref 3.5–5.2)
Sodium: 137 mmol/L (ref 134–144)
Total Protein: 7.3 g/dL (ref 6.0–8.5)
eGFR: 127 mL/min/{1.73_m2} (ref >59)

## 2021-02-07 LAB — TSH: TSH: 1.02 u[IU]/mL (ref 0.450–4.500)

## 2021-02-07 LAB — HIV ANTIBODY (ROUTINE TESTING W REFLEX): HIV Screen 4th Generation wRfx: NONREACTIVE

## 2021-02-07 LAB — HEMOGLOBIN A1C
Est. average glucose Bld gHb Est-mCnc: 97 mg/dL
Hgb A1c MFr Bld: 5 % (ref 4.8–5.6)

## 2021-02-07 LAB — RPR: RPR Ser Ql: NONREACTIVE

## 2021-06-25 ENCOUNTER — Other Ambulatory Visit: Payer: Self-pay | Admitting: Medical

## 2021-06-25 ENCOUNTER — Telehealth: Payer: Self-pay

## 2021-06-25 MED ORDER — METHYLPHENIDATE HCL ER (LA) 10 MG PO CP24: 10.0000 mg | ORAL_CAPSULE | Freq: Every day | ORAL | 0 refills | Status: DC

## 2021-06-25 NOTE — Telephone Encounter (Signed)
[facility]

## 2021-07-03 NOTE — Telephone Encounter (Signed)
Left message need appt before next refill

## 2021-07-29 ENCOUNTER — Ambulatory Visit (HOSPITAL_COMMUNITY)
Admission: EM | Admit: 2021-07-29 | Discharge: 2021-07-29 | Disposition: A | Discharge status: Home / Self Care | Payer: BC Managed Care – PPO | Attending: Family Medicine | Admitting: Family Medicine

## 2021-07-29 ENCOUNTER — Emergency Department (HOSPITAL_COMMUNITY)
Admission: EM | Admit: 2021-07-29 | Discharge: 2021-07-30 | Disposition: A | Discharge status: Home / Self Care | Payer: BC Managed Care – PPO | Attending: Emergency Medicine | Admitting: Emergency Medicine

## 2021-07-29 ENCOUNTER — Other Ambulatory Visit: Payer: Self-pay

## 2021-07-29 ENCOUNTER — Encounter (HOSPITAL_COMMUNITY): Payer: Self-pay

## 2021-07-29 DIAGNOSIS — R1031 Right lower quadrant pain: Secondary | ICD-10-CM | POA: Insufficient documentation

## 2021-07-29 LAB — POC URINE PREG, ED: Preg Test, Ur: NEGATIVE

## 2021-07-29 LAB — COMPREHENSIVE METABOLIC PANEL
ALT: 16 U/L (ref 0–44)
AST: 18 U/L (ref 15–41)
Albumin: 3.9 g/dL (ref 3.5–5.0)
Alkaline Phosphatase: 74 U/L (ref 38–126)
Anion gap: 6 (ref 5–15)
BUN: 8 mg/dL (ref 6–20)
CO2: 25 mmol/L (ref 22–32)
Calcium: 10.1 mg/dL (ref 8.9–10.3)
Chloride: 105 mmol/L (ref 98–111)
Creatinine, Ser: 0.72 mg/dL (ref 0.44–1.00)
GFR, Estimated: >60 mL/min (ref >60)
Glucose, Bld: 101 mg/dL — ABNORMAL HIGH (ref 70–99)
Potassium: 4.3 mmol/L (ref 3.5–5.1)
Sodium: 136 mmol/L (ref 135–145)
Total Bilirubin: 0.7 mg/dL (ref 0.3–1.2)
Total Protein: 7.5 g/dL (ref 6.5–8.1)

## 2021-07-29 LAB — CBC WITH DIFFERENTIAL/PLATELET
Abs Immature Granulocytes: 0.02 10*3/uL (ref 0.00–0.07)
Basophils Absolute: 0.1 10*3/uL (ref 0.0–0.1)
Basophils Relative: 1 %
Eosinophils Absolute: 0.1 10*3/uL (ref 0.0–0.5)
Eosinophils Relative: 1 %
HCT: 39.3 % (ref 36.0–46.0)
Hemoglobin: 12.8 g/dL (ref 12.0–15.0)
Immature Granulocytes: 0 %
Lymphocytes Relative: 26 %
Lymphs Abs: 2.2 10*3/uL (ref 0.7–4.0)
MCH: 29.6 pg (ref 26.0–34.0)
MCHC: 32.6 g/dL (ref 30.0–36.0)
MCV: 90.8 fL (ref 80.0–100.0)
Monocytes Absolute: 0.6 10*3/uL (ref 0.1–1.0)
Monocytes Relative: 7 %
Neutro Abs: 5.7 10*3/uL (ref 1.7–7.7)
Neutrophils Relative %: 65 %
Platelets: 392 10*3/uL (ref 150–400)
RBC: 4.33 MIL/uL (ref 3.87–5.11)
RDW: 11.5 % (ref 11.5–15.5)
WBC: 8.6 10*3/uL (ref 4.0–10.5)
nRBC: 0 % (ref 0.0–0.2)

## 2021-07-29 LAB — POCT URINALYSIS DIPSTICK, ED / UC
Bilirubin Urine: NEGATIVE
Glucose, UA: NEGATIVE mg/dL
Hgb urine dipstick: NEGATIVE
Ketones, ur: NEGATIVE mg/dL
Leukocytes,Ua: NEGATIVE
Nitrite: NEGATIVE
Protein, ur: NEGATIVE mg/dL
Specific Gravity, Urine: 1.02 (ref 1.005–1.030)
Urobilinogen, UA: 1 mg/dL (ref 0.0–1.0)
pH: 7 (ref 5.0–8.0)

## 2021-07-29 LAB — LIPASE, BLOOD: Lipase: 24 U/L (ref 11–51)

## 2021-07-29 NOTE — ED Provider Triage Note (Signed)
Emergency Medicine Provider Triage Evaluation Note ? ?Aimee Duncan , a 26 y.o. female  was evaluated in triage.  Pt complains of right lower quadrant abdominal pain.  Patient states that she was seen on 4/22 and diagnosed with UTI, placed on antibiotics.  The patient reports that she has been taking antibiotics without relief.  Patient was seen at urgent care earlier today and sent here to rule out appendicitis.  Patient endorsing right lower quadrant abdominal pain, nausea.  Patient denies any diarrhea, constipation, fevers, body aches or chills.  Patient denies history of ovarian cyst. ? ?Review of Systems  ?Positive:  ?Negative:  ? ?Physical Exam  ?BP 128/81   Pulse 91   Temp 98.5 ?F (36.9 ?C) (Oral)   Resp 16   LMP 07/14/2021   SpO2 100%  ?Gen:   Awake, no distress   ?Resp:  Normal effort  ?MSK:   Moves extremities without difficulty  ?Other:  Right lower quadrant abdominal pain and guarding on palpation. ? ?Medical Decision Making  ?Medically screening exam initiated at 9:39 PM.  Appropriate orders placed.  Aimee Duncan was informed that the remainder of the evaluation will be completed by another provider, this initial triage assessment does not replace that evaluation, and the importance of remaining in the ED until their evaluation is complete. ? ? ?  ?Al Decant, PA-C ?07/29/21 2140 ? ?

## 2021-07-29 NOTE — ED Provider Notes (Signed)
?[facility] ? ? ? ?CSN: [id number] ?Arrival date & time: 07/29/21  1956 ? ? ?  ? ?History   ?Chief Complaint ?Chief Complaint  ?Patient presents with  ? Abdominal Pain  ? ? ?HPI ?MAUD RUBENDALL is a 26 y.o. female.  ? ? ?Abdominal Pain ?Here for right sided abd pain that began 4/22. It has worsened today. Not much n/v. Is upper and lower. ? ?Past Medical History:  ?Diagnosis Date  ? ADD (attention deficit disorder)   ? Obesity   ? Premature birth   ? born to mother with eclampsia, born [redacted] wk gestation  ? ? ?Patient Active Problem List  ? Diagnosis Date Noted  ? Encounter for health maintenance examination in adult 02/05/2021  ? Screen for STD (sexually transmitted disease) 02/05/2021  ? Needs flu shot 02/05/2021  ? Screening for diabetes mellitus 02/05/2021  ? Screening for thyroid disorder 02/05/2021  ? Screening for deficiency anemia 02/05/2021  ? BMI 37.0-37.9, adult 02/05/2021  ? Nausea and vomiting 09/09/2020  ? Closed avulsion fracture of ankle with routine healing 09/09/2020  ? Attention deficit hyperactivity disorder (ADHD) 08/22/2020  ? High risk medication use 08/22/2020  ? Need for influenza vaccination 01/05/2019  ? Dysmenorrhea 11/05/2015  ? Eczema 11/05/2015  ? ? ?Past Surgical History:  ?Procedure Laterality Date  ? INGUINAL HERNIA REPAIR    ? left, age 36yo  ? ? ?OB History   ?No obstetric history on file. ?  ? ? ? ?Home Medications   ? ?Prior to Admission medications   ?Medication Sig Start Date End Date Taking? Authorizing Provider  ?chlorhexidine (PERIDEX) 0.12 % solution SMARTSIG:By Mouth ?Patient not taking: Reported on 02/05/2021 09/05/20   [provider]  ?fexofenadine (ALLEGRA ALLERGY) 180 MG tablet Take 1 tablet (180 mg total) by mouth daily. 01/05/19   Tysinger, Kermit Balo, PA-C  ?hydrocortisone valerate ointment (WESTCORT) 0.2 % Apply 1 application topically 2 (two) times daily. 01/05/19   Tysinger, Kermit Balo, PA-C  ?methylphenidate (RITALIN LA) 10 MG 24 hr capsule Take 1 capsule  (10 mg total) by mouth daily. 06/25/21   Tysinger, Kermit Balo, PA-C  ? ? ?Family History ?Family History  ?Problem Relation Age of Onset  ? Diabetes Mother   ? Hypertension Father   ? Sleep apnea Father   ? Dementia Maternal Grandmother   ? Heart disease Maternal Grandfather   ?     died of MI at age 35yo  ? Hypertension Paternal Grandmother   ? ? ?Social History ?Social History  ? ?Tobacco Use  ? Smoking status: Never  ? Smokeless tobacco: Never  ?Substance Use Topics  ? Alcohol use: No  ? Drug use: No  ? ? ? ?Allergies   ?Adderall [amphetamine-dextroamphetamine] ? ? ?Review of Systems ?Review of Systems  ?Gastrointestinal:  Positive for abdominal pain.  ? ? ?Physical Exam ?Triage Vital Signs ?ED Triage Vitals  ?Enc Vitals Group  ?   BP 07/29/21 2030 132/86  ?   Pulse Rate 07/29/21 2030 95  ?   Resp 07/29/21 2030 18  ?   Temp 07/29/21 2030 98.1 ?F (36.7 ?C)  ?   Temp Source 07/29/21 2030 Oral  ?   SpO2 07/29/21 2030 98 %  ?   Weight --   ?   Height --   ?   Head Circumference --   ?   Peak Flow --   ?   Pain Score 07/29/21 2029 5  ?   Pain Loc --   ?  Pain Edu? --   ?   Excl. in GC? --   ? ?No data found. ? ?Updated Vital Signs ?BP 132/86 (BP Location: Left Arm)   Pulse 95   Temp 98.1 ?F (36.7 ?C) (Oral)   Resp 18   LMP 07/14/2021   SpO2 98%  ? ?Visual Acuity ?Right Eye Distance:   ?Left Eye Distance:   ?Bilateral Distance:   ? ?Right Eye Near:   ?Left Eye Near:    ?Bilateral Near:    ? ?Physical Exam ?Vitals and nursing note reviewed.  ?Constitutional:   ?   General: She is not in acute distress. ?   Appearance: She is not ill-appearing, toxic-appearing or diaphoretic.  ?Cardiovascular:  ?   Rate and Rhythm: Normal rate and regular rhythm.  ?Pulmonary:  ?   Effort: Pulmonary effort is normal.  ?   Breath sounds: Normal breath sounds.  ?Abdominal:  ?   Palpations: Abdomen is soft. There is no mass.  ?   Tenderness: There is abdominal tenderness (RUQ and RLQ).  ?Neurological:  ?   General: No focal deficit  present.  ?   Mental Status: She is alert and oriented to person, place, and time.  ?Psychiatric:     ?   Behavior: Behavior normal.  ? ? ? ?UC Treatments / Results  ?Labs ?(all labs ordered are listed, but only abnormal results are displayed) ?Labs Reviewed  ?POCT URINALYSIS DIPSTICK, ED / UC  ?POC URINE PREG, ED  ? ? ?EKG ? ? ?Radiology ?No results found. ? ?Procedures ?Procedures (including critical care time) ? ?Medications Ordered in UC ?Medications - No data to display ? ?Initial Impression / Assessment and Plan / UC Course  ?I have reviewed the triage vital signs and the nursing notes. ? ?Pertinent labs & imaging results that were available during my care of the patient were reviewed by me and considered in my medical decision making (see chart for details). ? ?  ? ?UPT and UA neg. I have asked her to proceed to the ER for further eval. ?Final Clinical Impressions(s) / UC Diagnoses  ? ?Final diagnoses:  ?None  ? ?Discharge Instructions   ?None ?  ? ?ED Prescriptions   ?None ?  ? ?PDMP not reviewed this encounter. ?  ?Zenia Resides, MD ?07/29/21 2053 ? ?

## 2021-07-29 NOTE — Discharge Instructions (Addendum)
The urinalysis and the pregnancy test are negative again. ? ?Please proceed to the ER so they can evaluate you further, with possible lab and/or imaging. ?

## 2021-07-29 NOTE — ED Triage Notes (Signed)
Pt states right lower quadrant pain for the past three days. Pt states she was seen on 07/26/21.for the same issue, she has not gotten better. ?

## 2021-07-29 NOTE — ED Triage Notes (Signed)
Pt here for RLQ abd pain that has worsened since Saturday. Pt was dx w UTI at [facility], given antibiotics and pain has gotten worse. Endorses nausea, no vomiting. Denies back pain, fevers, and urinary symptoms.  ?

## 2021-07-30 ENCOUNTER — Emergency Department (HOSPITAL_COMMUNITY): Payer: BC Managed Care – PPO

## 2021-07-30 ENCOUNTER — Telehealth: Payer: Self-pay | Admitting: Medical

## 2021-07-30 LAB — HCG, SERUM, QUALITATIVE: Preg, Serum: NEGATIVE

## 2021-07-30 MED ORDER — IOHEXOL 300 MG/ML  SOLN
100.0000 mL | Freq: Once | INTRAMUSCULAR | Status: AC | PRN
  Administered 2021-07-30: 100 mL via INTRAVENOUS

## 2021-07-30 NOTE — Telephone Encounter (Signed)
Called pt and left a message for pt to call concerning recent ER visit. PT advise follow up with Vinc[facility] Hews was recommended.  ?

## 2021-07-30 NOTE — Discharge Instructions (Signed)
You were seen today for abdominal pain.  Your work-up is reassuring.  Monitor your symptoms closely.  Take Tylenol or ibuprofen as needed.  If you have new or worsening symptoms, you should be reevaluated. ?

## 2021-07-30 NOTE — ED Provider Notes (Signed)
?[facility] ?Provider Note ? ? ?CSN: [id number] ?Arrival date & time: 07/29/21  2101 ? ?  ? ?History ? ?Chief Complaint  ?Patient presents with  ? Abdominal Pain  ? ? ?Aimee Duncan is a 26 y.o. female. ? ?HPI ? ?  ? ?This is a 26 year old female who presents with abdominal pain.  Onset of abdominal pain on Saturday.  Patient reports intermittent right lower quadrant abdominal pain.  It is dull.  It comes and goes.  She states it worsened today.  No nausea, vomiting, diarrhea.  Denies dysuria.  Denies fevers.  Was seen and evaluated at urgent care and referred here.  Currently she states she is not in any pain.  She does not need any pain medication.  Urgent care with negative UPT and reassuring urine dip.  Patient denies vaginal discharge or concern for STDs. ? ?Home Medications ?Prior to Admission medications   ?Medication Sig Start Date End Date Taking? Authorizing Provider  ?chlorhexidine (PERIDEX) 0.12 % solution SMARTSIG:By Mouth ?Patient not taking: Reported on 02/05/2021 09/05/20   [provider]  ?fexofenadine (ALLEGRA ALLERGY) 180 MG tablet Take 1 tablet (180 mg total) by mouth daily. 01/05/19   Tysinger, Kermit Baloavid S, PA-C  ?hydrocortisone valerate ointment (WESTCORT) 0.2 % Apply 1 application topically 2 (two) times daily. 01/05/19   Tysinger, Kermit Baloavid S, PA-C  ?methylphenidate (RITALIN LA) 10 MG 24 hr capsule Take 1 capsule (10 mg total) by mouth daily. 06/25/21   Tysinger, Kermit Baloavid S, PA-C  ?   ? ?Allergies    ?Adderall [amphetamine-dextroamphetamine]   ? ?Review of Systems   ?Review of Systems  ?Constitutional:  Negative for fever.  ?Respiratory:  Negative for shortness of breath.   ?Cardiovascular:  Negative for chest pain.  ?Gastrointestinal:  Positive for abdominal pain. Negative for diarrhea, nausea and vomiting.  ?Genitourinary:  Negative for dysuria.  ?All other systems reviewed and are negative. ? ?Physical Exam ?Updated Vital Signs ?BP 101/82   Pulse 82   Temp  98.5 ?F (36.9 ?C) (Oral)   Resp 16   LMP 07/14/2021   SpO2 96%  ?Physical Exam ?Vitals and nursing note reviewed.  ?Constitutional:   ?   Appearance: She is well-developed. She is obese. She is not ill-appearing.  ?HENT:  ?   Head: Normocephalic and atraumatic.  ?Eyes:  ?   Pupils: Pupils are equal, round, and reactive to light.  ?Cardiovascular:  ?   Rate and Rhythm: Normal rate and regular rhythm.  ?   Heart sounds: Normal heart sounds.  ?Pulmonary:  ?   Effort: Pulmonary effort is normal. No respiratory distress.  ?   Breath sounds: No wheezing.  ?Abdominal:  ?   General: Bowel sounds are normal.  ?   Palpations: Abdomen is soft.  ?   Tenderness: There is abdominal tenderness in the right lower quadrant. There is no guarding or rebound.  ?Musculoskeletal:  ?   Cervical back: Neck supple.  ?Skin: ?   General: Skin is warm and dry.  ?Neurological:  ?   General: No focal deficit present.  ?   Mental Status: She is alert and oriented to person, place, and time.  ? ? ?ED Results / Procedures / Treatments   ?Labs ?(all labs ordered are listed, but only abnormal results are displayed) ?Labs Reviewed  ?COMPREHENSIVE METABOLIC PANEL - Abnormal; Notable for the following components:  ?    Result Value  ? Glucose, Bld 101 (*)   ? All  other components within normal limits  ?CBC WITH DIFFERENTIAL/PLATELET  ?LIPASE, BLOOD  ?HCG, SERUM, QUALITATIVE  ?URINALYSIS, ROUTINE W REFLEX MICROSCOPIC  ? ? ?EKG ?None ? ?Radiology ?US PELVIS (TRANSABDOMINAL ONLY) ? ?Result Date: 07/30/2021 ?CLINICAL DATA:  Right lower quadrant pain EXAM: TRANSABDOMINAL ULTRASOUND OF PELVIS DOPPLER ULTRASOUND OF OVARIES TECHNIQUE: Transabdominal ultrasound examination of the pelvis was performed including evaluation of the uterus, ovaries, adnexal regions, and pelvic cul-de-sac. Color and duplex Doppler ultrasound was utilized to evaluate blood flow to the ovaries. COMPARISON:  CT earlier today FINDINGS: Uterus Measurements: 7.1 x 4.2 x 5.2 cm = volume:  82 mL. No fibroids or other mass visualized. Endometrium Thickness: 7 mm in thickness.  No focal abnormality visualized. Right ovary Measurements: 3.0 x 1.6 x 3.3 cm = volume: 8.1 mL. Normal appearance/no adnexal mass. Left ovary Measurements: 3.2 x 1.8 x 3.3 cm = volume: 9.9 mL. Normal appearance/no adnexal mass. Pulsed Doppler evaluation demonstrates normal low-resistance arterial and venous waveforms in both ovaries. Other: No free fluid.  Patient refused transvaginal imaging. IMPRESSION: No acute or significant abnormality. No evidence of ovarian mass or torsion. Electronically Signed   By: Charlett Nose M.D.   On: 07/30/2021 03:26  ? ?CT Abdomen Pelvis W Contrast ? ?Result Date: 07/30/2021 ?CLINICAL DATA:  Right lower quadrant pain EXAM: CT ABDOMEN AND PELVIS WITH CONTRAST TECHNIQUE: Multidetector CT imaging of the abdomen and pelvis was performed using the standard protocol following bolus administration of intravenous contrast. RADIATION DOSE REDUCTION: This exam was performed according to the departmental dose-optimization program which includes automated exposure control, adjustment of the mA and/or kV according to patient size and/or use of iterative reconstruction technique. CONTRAST:  OMNIPAQUE IOHEXOL 300 MG/ML  SOLN COMPARISON:  None. FINDINGS: Lower chest: No acute abnormality Hepatobiliary: No focal hepatic abnormality. Gallbladder unremarkable. Pancreas: No focal abnormality or ductal dilatation. Spleen: No focal abnormality.  Normal size. Adrenals/Urinary Tract: No adrenal abnormality. No focal renal abnormality. No stones or hydronephrosis. Urinary bladder is unremarkable. Stomach/Bowel: Stomach, large and small bowel grossly unremarkable. Normal appendix. Vascular/Lymphatic: No evidence of aneurysm or adenopathy. Reproductive: Uterus and adnexa unremarkable.  No mass. Other: No free fluid or free air. Musculoskeletal: No acute bony abnormality. IMPRESSION: Normal appendix. No acute findings  in the abdomen or pelvis. Electronically Signed   By: Charlett Nose M.D.   On: 07/30/2021 02:21  ? ?US PELVIC DOPPLER (TORSION R/O OR MASS ARTERIAL FLOW) ? ?Result Date: 07/30/2021 ?CLINICAL DATA:  Right lower quadrant pain EXAM: TRANSABDOMINAL ULTRASOUND OF PELVIS DOPPLER ULTRASOUND OF OVARIES TECHNIQUE: Transabdominal ultrasound examination of the pelvis was performed including evaluation of the uterus, ovaries, adnexal regions, and pelvic cul-de-sac. Color and duplex Doppler ultrasound was utilized to evaluate blood flow to the ovaries. COMPARISON:  CT earlier today FINDINGS: Uterus Measurements: 7.1 x 4.2 x 5.2 cm = volume: 82 mL. No fibroids or other mass visualized. Endometrium Thickness: 7 mm in thickness.  No focal abnormality visualized. Right ovary Measurements: 3.0 x 1.6 x 3.3 cm = volume: 8.1 mL. Normal appearance/no adnexal mass. Left ovary Measurements: 3.2 x 1.8 x 3.3 cm = volume: 9.9 mL. Normal appearance/no adnexal mass. Pulsed Doppler evaluation demonstrates normal low-resistance arterial and venous waveforms in both ovaries. Other: No free fluid.  Patient refused transvaginal imaging. IMPRESSION: No acute or significant abnormality. No evidence of ovarian mass or torsion. Electronically Signed   By: Charlett Nose M.D.   On: 07/30/2021 03:26   ? ?Procedures ?Procedures  ? ? ?Medications Ordered in ED ?  Medications  ?iohexol (OMNIPAQUE) 300 MG/ML solution 100 mL (100 mLs Intravenous Contrast Given 07/30/21 0216)  ? ? ?ED Course/ Medical Decision Making/ A&P ?  ?                        ?Medical Decision Making ?Amount and/or Complexity of Data Reviewed ?Labs: ordered. ?Radiology: ordered. ? ? ?This patient presents to the ED for concern of abdominal pain, this involves an extensive number of treatment options, and is a complaint that carries with it a high risk of complications and morbidity.  The differential diagnosis includes appendicitis, ovarian pathology such as ovarian cyst or torsion, pregnancy,  UTI, kidney stone ? ?MDM:   ? ?This is a 26 year old female who presents with right lower quadrant pain.  She is nontoxic and vital signs are reassuring.  She denies concerns for STDs.  States she is not sexua

## 2021-07-30 NOTE — ED Notes (Signed)
Patient transported to US 

## 2021-08-29 ENCOUNTER — Ambulatory Visit (INDEPENDENT_AMBULATORY_CARE_PROVIDER_SITE_OTHER): Payer: BC Managed Care – PPO | Admitting: Medical

## 2021-08-29 VITALS — BP 110/80 | HR 85 | Wt 217.4 lb

## 2021-08-29 DIAGNOSIS — R4589 Other symptoms and signs involving emotional state: Secondary | ICD-10-CM | POA: Diagnosis not present

## 2021-08-29 DIAGNOSIS — F909 Attention-deficit hyperactivity disorder, unspecified type: Secondary | ICD-10-CM | POA: Diagnosis not present

## 2021-08-29 MED ORDER — METHYLPHENIDATE HCL 10 MG PO TABS: ORAL_TABLET | ORAL | 0 refills | Status: DC

## 2021-08-29 MED ORDER — BUPROPION HCL ER (XL) 150 MG PO TB24: 150.0000 mg | ORAL_TABLET | Freq: Every day | ORAL | 1 refills | Status: DC

## 2021-08-29 NOTE — Progress Notes (Addendum)
Subjective:  Aimee Duncan is a 26 y.o. female who presents for Chief Complaint  Patient presents with   med check    Med check. Medication is causing depression epsiodes since Saturday and will cry for hours     Here for med check.    Generally takes Ritalin Tuesday through Saturday since we started this past year.  She has not been taking on her days however this past weekend and this past Wednesday started having feelings of depression, crying spells, and sleep issues.  Falls asleep but has trouble staying asleep.  Lately just going to work and sleeping, not engaging in other activities.  She notes that she has not really had these types of symptoms of depression or crying spells since starting the medication.  She does note a period of depressed mood in the past though  Got an apartment 06/2021, by herself living now.  Was living with best friend and grandmother back and forth prior to that.     No recent changes or stressors regarding friends, family or other.     No recent diet changes.   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  Enjoys this, no problems on the job.     Exercising - not currently.   Has been thinking about this.   No hallucinations, no delusions.  She has had some thoughts of not being here, suicide but no plan.    Her relationship with her mother is not the best.  Her mother did not call her on her birthday.  Her mother got upset when Aspasia in turn did call well.  They have had some difficulties with their relationship.  She has a good relationship with her father though.  No other aggravating or relieving factors.    No other c/o.  The following portions of the patient's history were reviewed and updated as appropriate: allergies, current medications, past family history, past medical history, past social history, past surgical history and problem list.  ROS Otherwise as in subjective above  Objective: BP 110/80   Pulse 85   Wt 217 lb  6.4 oz (98.6 kg)   BMI 37.32 kg/m   General appearance: alert, no distress, well developed, well nourished Psych: Pleasant, answers questions appropriate, good eye contact   Assessment: Encounter Diagnoses  Name Primary?   Attention deficit hyperactivity disorder (ADHD), unspecified ADHD type Yes   Depressed mood      Plan: I reviewed her PHQ-9 which was quite abnormal today.  We had a long discussion about her concerns and symptoms.  We discussed that medication could be playing a role.  We will switch to a regular release dosing of methylphenidate which she can use half or whole tablet as needed instead of the long-acting version.  Hopefully this will help.  She did not want to stop this medicine as it has been helpful with her focus and attention.  She will use a lower dose on her days off instead of stopping it completely on her days off.  She was agreeable to beginning Wellbutrin to help with mood.  We discussed risk and benefits and proper use of medication.  We will refer to counseling.  She gave permission for [place] to refer to Tawnya Crook  We discussed crisis hotlines, local emergency department for mental health if she were to feel SI. She denies any current plan or propensity to act on her recent SI.  I encouraged her to start exercising.  We  had talked about overall life goals, career planning and other.  [name] was seen today for med check.  Diagnoses and all orders for this visit:  Attention deficit hyperactivity disorder (ADHD), unspecified ADHD type  Depressed mood  Other orders -     methylphenidate (RITALIN) 10 MG tablet; 1/2- 1 tablet twice daily, 1/2 tablet once daily on non work days -     buPROPion (WELLBUTRIN XL) 150 MG 24 hr tablet; Take 1 tablet (150 mg total) by mouth daily.    Follow up: 2 wk, sooner prn

## 2021-08-29 NOTE — Patient Instructions (Addendum)
Recommendations  STOP the Methylphenidate Ritalin LA.  BEGIN Methylphenidate regular release dosing.  You can use a whole tablet or even 1/2 tablet twice dailly for days you work . This would be similar to the Ritalin LA you were using.  On you days off, consider doing 1/2 tablet ONCE daily so the medication is still in your system to prevent mood fluctuations.  BEGIN Wellbutrin 150mg  XL . This is a medicatino to help with mood.  I think this will be helpful.    We will work to get you into a counselor ASAP.  Start walking or doing some exercise regulalry    RESOURCES in [place], [place]  If you are experiencing a mental health crisis or an emergency, please call 911 or go to the nearest emergency department.  [facility]   [phone] [facility]  [phone] [facility]   [phone]  Suicide Hotline 1-800-Suicide (774)628-2537)  National Suicide Prevention Lifeline (787) 884-4211  984 576 8808)  Domestic Violence, Rape/Crisis - [facility] [place] [phone]  The [employer] Violence Hotline 1-800-799-SAFE 6814603625)  To report Child or Elder Abuse, please call: [facility] Police Department  [phone] [facility] Department  [phone]  Teen Crisis line [phone] or [phone]     Psychiatry and [place] and Main phone number [facility]  [facility] [place] [phone]

## 2021-08-29 NOTE — Addendum Note (Signed)
Addended by: Herminio Commons A on: 08/29/2021 10:17 AM   Modules accepted: Orders

## 2021-09-02 ENCOUNTER — Encounter: Payer: BC Managed Care – PPO | Admitting: Medical

## 2021-09-20 ENCOUNTER — Other Ambulatory Visit: Payer: Self-pay | Admitting: Medical

## 2021-09-22 NOTE — Telephone Encounter (Signed)
Pharmacy is requesting 90 day refill of Wellbutrin

## 2021-10-21 ENCOUNTER — Ambulatory Visit: Payer: BC Managed Care – PPO | Admitting: Medical

## 2021-10-21 ENCOUNTER — Encounter: Payer: Self-pay | Admitting: Medical

## 2021-10-21 VITALS — BP 120/70 | HR 89 | Wt 219.0 lb

## 2021-10-21 DIAGNOSIS — R4589 Other symptoms and signs involving emotional state: Secondary | ICD-10-CM | POA: Diagnosis not present

## 2021-10-21 DIAGNOSIS — F909 Attention-deficit hyperactivity disorder, unspecified type: Secondary | ICD-10-CM

## 2021-10-21 DIAGNOSIS — Z79899 Other long term (current) drug therapy: Secondary | ICD-10-CM

## 2021-10-21 DIAGNOSIS — F419 Anxiety disorder, unspecified: Secondary | ICD-10-CM

## 2021-10-21 MED ORDER — METHYLPHENIDATE HCL 10 MG PO TABS: ORAL_TABLET | ORAL | 0 refills | Status: DC

## 2021-10-21 NOTE — Progress Notes (Signed)
Subjective:  Aimee Duncan is a 26 y.o. female who presents for Chief Complaint  Patient presents with   needs letter for emotional support animal    Needs a letter for emotional support animal since she lives in an apartment. Needs refill on ritalin     Here for recheck on ADHD and mood.  She is compliant with Ritalin.  She takes 1.5 tablet around 1 PM.  She is just doing the Ritalin once daily  If she takes it later in the day or too high of a dose it would interfere with sleep.  She works 3 to 11:30 PM's, second shift.  Currently she is eating lunch around 11 AM but not taking her rhythm until about 1 PM.  She says the label says to take 30 minutes before meals.  She would like to be able to take her medicine with lunch instead of eating lunch at 11am, but waiting til1:00 to take the medication.  The later she takes the medicine the days of the worse her sleep is.  She had a worse problem Ritalin LA regarding sleep  Seeing a counselor regularly, weekly.  Her counselor did have her to work on journaling and to be more social.  She has been getting out of the house with friends more of late.  She would like a letter for emotional support animal.  She has a kitten and this helps her cope with her issues.  She is dealt with a lot of isolation in the past year until just recently when she is able to get out of the house to do some social things.  Her landlord does not allow for animals so she is requesting emotional support animal letter so she can keep her kitten  No other aggravating or relieving factors.    No other c/o.  The following portions of the patient's history were reviewed and updated as appropriate: allergies, current medications, past family history, past medical history, past social history, past surgical history and problem list.  ROS Otherwise as in subjective above  Objective: BP 120/70   Pulse 89   Wt 219 lb (99.3 kg)   BMI 37.59 kg/m   General appearance: alert, no  distress, well developed, well nourished Psych: Pleasant, answers questions appropriately   Assessment: Encounter Diagnoses  Name Primary?   Attention deficit hyperactivity disorder (ADHD), unspecified ADHD type Yes   High risk medication use    Depressed mood      Plan: ADHD, depressed mood Patient Instructions  You current have Ritalin 10mg  regular release tablets  I recommend Ritalin 10mg , 1 tablet at lunch or 30 minutes before lunch. If needed you can do 1/2 tablet or 5mg  later in the evening such as 6pm for example  The concern would be wether the 1/2 tablet evening dose interferes with sleep or not.   Alternately, if you are seeing better improvement on 1.5 tablets daily, then just stick with 1.5 tablets but change to lunch time since this is more convenient for you.    Given the sleep concerns, either don't take an evening dose or stick to 1/2 tablet for evening dose.    You could also do 1/4 dosing for evening as an option as well  Let me review some guidelines on emotional support animal before we proceed with a letter   Addendum: Letter prepared and will be sent to patient  Follow up: Pending callback

## 2021-10-21 NOTE — Patient Instructions (Signed)
You current have Ritalin 10mg  regular release tablets  I recommend Ritalin 10mg , 1 tablet at lunch or 30 minutes before lunch. If needed you can do 1/2 tablet or 5mg  later in the evening such as 6pm for example  The concern would be wether the 1/2 tablet evening dose interferes with sleep or not.   Alternately, if you are seeing better improvement on 1.5 tablets daily, then just stick with 1.5 tablets but change to lunch time since this is more convenient for you.    Given the sleep concerns, either don't take an evening dose or stick to 1/2 tablet for evening dose.    You could also do 1/4 dosing for evening as an option as well  Let me review some guidelines on emotional support animal before we proceed with a letter

## 2021-12-10 ENCOUNTER — Encounter: Payer: Self-pay | Admitting: Internal Medicine

## 2021-12-19 ENCOUNTER — Telehealth: Payer: Self-pay | Admitting: Medical

## 2021-12-19 MED ORDER — METHYLPHENIDATE HCL 10 MG PO TABS
ORAL_TABLET | ORAL | 0 refills | Status: DC
Start: 2021-12-19 — End: 2022-03-02

## 2021-12-19 NOTE — Telephone Encounter (Signed)
Pt asking for refill on her ritalin to [id number] - [place], [place] - [address]

## 2021-12-24 ENCOUNTER — Other Ambulatory Visit: Payer: Self-pay | Admitting: Medical

## 2022-01-13 ENCOUNTER — Encounter: Payer: Self-pay | Admitting: Internal Medicine

## 2022-03-02 ENCOUNTER — Telehealth: Payer: Self-pay | Admitting: Medical

## 2022-03-02 ENCOUNTER — Other Ambulatory Visit: Payer: Self-pay | Admitting: Medical

## 2022-03-02 MED ORDER — BUPROPION HCL ER (XL) 150 MG PO TB24: 150.0000 mg | ORAL_TABLET | Freq: Every day | ORAL | 0 refills | Status: DC

## 2022-03-02 MED ORDER — METHYLPHENIDATE HCL 10 MG PO TABS: ORAL_TABLET | ORAL | 0 refills | Status: DC

## 2022-03-02 NOTE — Telephone Encounter (Signed)
Pt called requesting refill on Wellbutrin and Ritalin     [facility]

## 2022-04-06 HISTORY — PX: ANKLE SURGERY: SHX546

## 2022-05-11 DIAGNOSIS — F331 Major depressive disorder, recurrent, moderate: Secondary | ICD-10-CM | POA: Diagnosis not present

## 2022-05-11 DIAGNOSIS — R69 Illness, unspecified: Secondary | ICD-10-CM | POA: Diagnosis not present

## 2022-05-15 ENCOUNTER — Telehealth (INDEPENDENT_AMBULATORY_CARE_PROVIDER_SITE_OTHER): Payer: 59 | Admitting: Nurse Practitioner

## 2022-05-15 ENCOUNTER — Encounter: Payer: Self-pay | Admitting: Nurse Practitioner

## 2022-05-15 VITALS — Temp 98.0°F | Wt 220.0 lb

## 2022-05-15 DIAGNOSIS — U071 COVID-19: Secondary | ICD-10-CM | POA: Insufficient documentation

## 2022-05-15 MED ORDER — PROMETHAZINE-DM 6.25-15 MG/5ML PO SYRP: 5.0000 mL | ORAL_SOLUTION | Freq: Four times a day (QID) | ORAL | 0 refills | Status: DC | PRN

## 2022-05-15 MED ORDER — IBUPROFEN 600 MG PO TABS: 600.0000 mg | ORAL_TABLET | Freq: Three times a day (TID) | ORAL | 0 refills | Status: DC | PRN

## 2022-05-15 MED ORDER — BENZONATATE 200 MG PO CAPS: 200.0000 mg | ORAL_CAPSULE | Freq: Three times a day (TID) | ORAL | 0 refills | Status: DC | PRN

## 2022-05-15 NOTE — Assessment & Plan Note (Signed)
Symptoms consistent with COVID-19, including fever, scratchy throat, fatigue, cough, and headache since Wednesday. COVID testing yielded one faintly positive result, with other tests inconclusive. Given the faint positive, we will assume positive findings. Patient is not high risk by age or chronic disease and her symptoms are mild. Will plan for conservative treatment.  PLAN: - Plan: Instructed patient to maintain isolation until day five (Sunday) and continue symptom monitoring. If fever subsides for at least 24 hours without medication and symptoms improve, patient may resume normal activities with mask usage.  Prescribe cough pearls to mitigate cough and congestion symptoms. Caution patient regarding potential sedative effects of cough medication.  - Suggest additional relief with over-the-counter Theraflu and Sudafed. - Prescribed prescription-strength Motrin for pain relief. - Issued a doctor's note to patient to justify work absence due to suspected COVID-19 infection. - Educated patient on the significance of adequate rest, hydration, and symptom vigilance. Directed patient to seek immediate medical attention for severe respiratory distress or persistent high-grade fevers unresponsive to Tylenol and Ibuprofen. Encouraged patient to reach out to the clinic with any concerns or queries.

## 2022-05-15 NOTE — Progress Notes (Signed)
Virtual Visit Encounter mychart visit.   I connected with  Aimee Duncan on 05/15/22 at  2:45 PM EST by secure video and audio telemedicine application. I verified that I am speaking with the correct person using two identifiers.   I introduced myself as a Designer, jewellery with the practice. The limitations of evaluation and management by telemedicine discussed with the patient and the availability of in person appointments. The patient expressed verbal understanding and consent to proceed.  Participating parties in this visit include: Myself and patient  The patient is: Patient Location: Home I am: Provider Location: Office/Clinic Subjective:    CC and HPI: Aimee Duncan is a 27 y.o. year old female presenting for new evaluation and treatment of flu-like symptoms. Patient reports the following: Hanley endorses concerns regarding symptoms consistent with a possible COVID-19 infection. She reports a fever fluctuating between 99 and 100 degrees Fahrenheit, accompanied by a scratchy throat, fatigue, and a persistent cough. The onset of symptoms was on Wednesday, beginning with a slight headache and the aforementioned scratchy throat. The fever commenced the following day, Thursday, but the patient managed to break the fever this morning. Despite this, the patient continues to experience congestion, a cough, and a slight headache.  Regarding COVID-19 testing, the patient has completed four tests. The initial test yielded a faint positive line, while subsequent tests have been inconclusive. This is the patient's initial encounter with potential COVID-19 symptoms, and they have been vaccinated with the first two doses and a booster shot.  The patient also mentions experiencing occasional nausea but has not vomited. They have anti-nausea medication available if needed. The patient has no prior history of influenza or COVID and is expressing concern about the current symptoms they are facing.  Past  medical history, Surgical history, Family history not pertinant except as noted below, Social history, Allergies, and medications have been entered into the medical record, reviewed, and corrections made.   Review of Systems:  All review of systems negative except what is listed in the HPI  Objective:    Alert and oriented x 4 Speaking in clear sentences with no shortness of breath. No distress.  Impression and Recommendations:    Problem List Items Addressed This Visit     COVID-19 - Primary    Symptoms consistent with COVID-19, including fever, scratchy throat, fatigue, cough, and headache since Wednesday. COVID testing yielded one faintly positive result, with other tests inconclusive. Given the faint positive, we will assume positive findings. Patient is not high risk by age or chronic disease and her symptoms are mild. Will plan for conservative treatment.  PLAN: - Plan: Instructed patient to maintain isolation until day five (Sunday) and continue symptom monitoring. If fever subsides for at least 24 hours without medication and symptoms improve, patient may resume normal activities with mask usage.  Prescribe cough pearls to mitigate cough and congestion symptoms. Caution patient regarding potential sedative effects of cough medication.  - Suggest additional relief with over-the-counter Theraflu and Sudafed. - Prescribed prescription-strength Motrin for pain relief. - Issued a doctor's note to patient to justify work absence due to suspected COVID-19 infection. - Educated patient on the significance of adequate rest, hydration, and symptom vigilance. Directed patient to seek immediate medical attention for severe respiratory distress or persistent high-grade fevers unresponsive to Tylenol and Ibuprofen. Encouraged patient to reach out to the clinic with any concerns or queries.      Relevant Medications   promethazine-dextromethorphan (PROMETHAZINE-DM) 6.25-15 MG/5ML syrup  benzonatate (TESSALON) 200 MG capsule   ibuprofen (ADVIL) 600 MG tablet    orders and follow up as documented in EMR I discussed the assessment and treatment plan with the patient. The patient was provided an opportunity to ask questions and all were answered. The patient agreed with the plan and demonstrated an understanding of the instructions.   The patient was advised to call back or seek an in-person evaluation if the symptoms worsen or if the condition fails to improve as anticipated.  Follow-Up: prn  I provided 19 minutes of non-face-to-face interaction with this non face-to-face encounter including intake, same-day documentation, and chart review.   Orma Render, NP , DNP, AGNP-c [facility]

## 2022-05-15 NOTE — Patient Instructions (Signed)
Dear Aimee Duncan,  Thank you for your visit today. Based on our conversation, I have provided a list of instructions to help you manage your symptoms and recover from your illness.  1. Medications:    - Cough pearls: Take as prescribed to help with your cough.    - Prescription strength Motrin: Take as prescribed for body aches, fever, and headache.    - Theraflu and Sudafed: Use as needed for congestion.    - Cough syrup: Take as prescribed to help with cough and congestion. Be aware that it may cause drowsiness.  2. Isolation and Quarantine:    - Stay in isolation or quarantine for the first five days of symptoms, which ends on Sunday.    - After day five, if your fever has been gone for at least 24 hours without any medicine and you're feeling better, you can go back out but wear a mask.  3. Self-care:    - Drink lots of water and sleep as much as your body needs.    - Use Vicks VapoRub for congestion relief.    - Take your nausea medicine if you experience nausea.  4. Emergency Room Indications:    - Severe shortness of breath.    - Fevers above 103F that are not coming down with Tylenol and Ibuprofen.  5. Doctor's Note:    - I will provide a doctor's note for your supervisor and email it to you. If you have any issues seeing it, please let us know.  Please remember to rest and stay hydrated during your recovery. If you have any further questions or concerns, don't hesitate to reach out. We hope you feel better soon!  Sincerely,  [place]

## 2022-05-21 ENCOUNTER — Other Ambulatory Visit: Payer: Self-pay | Admitting: Medical

## 2022-05-21 MED ORDER — METHYLPHENIDATE HCL 10 MG PO TABS: ORAL_TABLET | ORAL | 0 refills | Status: DC

## 2022-05-21 NOTE — Telephone Encounter (Signed)
From: Reesa Chew To: Office of Dorothea Ogle, [place] Sent: 05/15/2022 3:56 PM EST Subject: Medication Renewal Request  Refills have been requested for the following medications:   methylphenidate (RITALIN) 10 MG tablet [Shane Meliyah Simon]  [id number] [place] [place] [address] [place]: [employer]

## 2022-07-20 ENCOUNTER — Ambulatory Visit: Payer: 59 | Admitting: Medical

## 2022-07-20 ENCOUNTER — Encounter: Payer: Self-pay | Admitting: Medical

## 2022-07-20 VITALS — BP 130/68 | HR 95 | Ht 64.5 in | Wt 239.8 lb

## 2022-07-20 DIAGNOSIS — Z131 Encounter for screening for diabetes mellitus: Secondary | ICD-10-CM | POA: Diagnosis not present

## 2022-07-20 DIAGNOSIS — F419 Anxiety disorder, unspecified: Secondary | ICD-10-CM | POA: Diagnosis not present

## 2022-07-20 DIAGNOSIS — R4589 Other symptoms and signs involving emotional state: Secondary | ICD-10-CM

## 2022-07-20 DIAGNOSIS — Z1329 Encounter for screening for other suspected endocrine disorder: Secondary | ICD-10-CM

## 2022-07-20 DIAGNOSIS — Z13220 Encounter for screening for lipoid disorders: Secondary | ICD-10-CM | POA: Diagnosis not present

## 2022-07-20 DIAGNOSIS — Z0000 Encounter for general adult medical examination without abnormal findings: Secondary | ICD-10-CM

## 2022-07-20 DIAGNOSIS — Z113 Encounter for screening for infections with a predominantly sexual mode of transmission: Secondary | ICD-10-CM | POA: Diagnosis not present

## 2022-07-20 DIAGNOSIS — F909 Attention-deficit hyperactivity disorder, unspecified type: Secondary | ICD-10-CM | POA: Diagnosis not present

## 2022-07-20 MED ORDER — BUPROPION HCL ER (XL) 150 MG PO TB24: 150.0000 mg | ORAL_TABLET | Freq: Every day | ORAL | 1 refills | Status: DC

## 2022-07-20 MED ORDER — METHYLPHENIDATE HCL 10 MG PO TABS: ORAL_TABLET | ORAL | 0 refills | Status: DC

## 2022-07-20 NOTE — Progress Notes (Signed)
Subjective:   HPI  Aimee Duncan is a 27 y.o. female who presents for Chief Complaint  Patient presents with   nonfasting cpe    Nonfasting. Had protein shake around 11am. No food since 10:30, would like a referral to obgyn as she had never had a pap smear    Patient Care Team: Destiny Hagin, Cleda Mccreedy as PCP - General (Family Medicine) Sees dentist Sees eye doctor   Concerns: none  Gynecological history:  no pregnancies, periods are normal, no heavy.  Sexually active, not on birth control  Working full time, going to school part time for computer programing.   Doing fine on current medications  No other issues.   Reviewed their medical, surgical, family, social, medication, and allergy history and updated chart as appropriate.  Past Medical History:  Diagnosis Date   ADD (attention deficit disorder)    Need for influenza vaccination 01/05/2019   Needs flu shot 02/05/2021   Obesity    Premature birth    born to mother with eclampsia, born [redacted] wk gestation    Family History  Problem Relation Age of Onset   Diabetes Mother    Hypertension Father    Sleep apnea Father    Dementia Maternal Grandmother    Heart disease Maternal Grandfather        died of MI at age 39yo   Hypertension Paternal Grandmother      Current Outpatient Medications:    fexofenadine (ALLEGRA ALLERGY) 180 MG tablet, Take 1 tablet (180 mg total) by mouth daily., Disp: 90 tablet, Rfl: 3   hydrocortisone valerate ointment (WESTCORT) 0.2 %, Apply 1 application topically 2 (two) times daily., Disp: 60 g, Rfl: 1   buPROPion (WELLBUTRIN XL) 150 MG 24 hr tablet, Take 1 tablet (150 mg total) by mouth daily., Disp: 90 tablet, Rfl: 1   methylphenidate (RITALIN) 10 MG tablet, 1-1.5 tablet at lunch daily, 1/2 tablet QHS if needed, Disp: 60 tablet, Rfl: 0  Allergies  Allergen Reactions   Adderall [Amphetamine-Dextroamphetamine]     Nausea, crying spells, mood change   Review of Systems   Constitutional:  Negative for chills, fever, malaise/fatigue and weight loss.  HENT:  Negative for congestion, ear pain, hearing loss, sore throat and tinnitus.   Eyes:  Negative for blurred vision, pain and redness.  Respiratory:  Negative for cough, hemoptysis and shortness of breath.   Cardiovascular:  Negative for chest pain, palpitations, orthopnea, claudication and leg swelling.  Gastrointestinal:  Negative for abdominal pain, blood in stool, constipation, diarrhea, nausea and vomiting.  Genitourinary:  Negative for dysuria, flank pain, frequency, hematuria and urgency.  Musculoskeletal:  Negative for falls, joint pain and myalgias.  Skin:  Negative for itching and rash.  Neurological:  Negative for dizziness, tingling, speech change, weakness and headaches.  Endo/Heme/Allergies:  Negative for polydipsia. Does not bruise/bleed easily.  Psychiatric/Behavioral:  Negative for depression and memory loss. The patient is not nervous/anxious and does not have insomnia.          07/20/2022    2:11 PM 08/29/2021    9:23 AM 02/05/2021    3:17 PM 08/22/2020   10:18 AM  Depression screen PHQ 2/9  Decreased Interest 1 3 0 0  Down, Depressed, Hopeless 1 3 0 0  PHQ - 2 Score 2 6 0 0  Altered sleeping 1 3    Tired, decreased energy 0 3    Change in appetite 0 3    Feeling bad or failure about yourself  1 3    Trouble concentrating 1 3    Moving slowly or fidgety/restless 0 3    Suicidal thoughts 0 3    PHQ-9 Score 5 27    Difficult doing work/chores Somewhat difficult Extremely dIfficult         Objective:  BP 130/68   Pulse 95   Ht 5' 4.5" (1.638 m)   Wt 239 lb 12.8 oz (108.8 kg)   LMP 06/21/2022   BMI 40.53 kg/m   General appearance: alert, no distress, WD/WN, African American female Skin: unremarkable HEENT: normocephalic, conjunctiva/corneas normal, sclerae anicteric, PERRLA, EOMi Neck: supple, no lymphadenopathy, no thyromegaly, no masses, normal ROM, no bruits Chest: non  tender, normal shape and expansion Heart: RRR, normal S1, S2, no murmurs Lungs: CTA bilaterally, no wheezes, rhonchi, or rales Abdomen: +bs, soft, non tender, non distended, no masses, no hepatomegaly, no splenomegaly, no bruits Back: non tender, normal ROM, no scoliosis Musculoskeletal: right anterior forearm with tattoo, to left volar wrist, upper extremities non tender, no obvious deformity, normal ROM throughout, lower extremities non tender, no obvious deformity, normal ROM throughout Extremities: no edema, no cyanosis, no clubbing Pulses: 2+ symmetric, upper and lower extremities, normal cap refill Neurological: alert, oriented x 3, CN2-12 intact, strength normal upper extremities and lower extremities, sensation normal throughout, DTRs 2+ throughout, no cerebellar signs, gait normal Psychiatric: normal affect, behavior normal, pleasant  Breast/gyn/rectal - deferred to gynecology     Assessment and Plan :   Encounter Diagnoses  Name Primary?   Encounter for health maintenance examination in adult Yes   Screening for thyroid disorder    Screening for diabetes mellitus    Screen for STD (sexually transmitted disease)    Depressed mood    Attention deficit hyperactivity disorder (ADHD), unspecified ADHD type    Anxiety    Screening for lipid disorders      This visit was a preventative care visit, also known as wellness visit or routine physical.   Topics typically include healthy lifestyle, diet, exercise, preventative care, vaccinations, sick and well care, proper use of emergency dept and after hours care, as well as other concerns.     Recommendations: Continue to return yearly for your annual wellness and preventative care visits.  This gives [place] a chance to discuss healthy lifestyle, exercise, vaccinations, review your chart record, and perform screenings where appropriate.  I recommend you see your eye doctor yearly for routine vision care.  I recommend you see your  dentist yearly for routine dental care including hygiene visits twice yearly.  See your gynecologist yearly for routine gynecological care.   Vaccination recommendations were reviewed Immunization History  Administered Date(s) Administered   DTaP 05/22/1998, 07/09/1998, 10/31/1998, 12/17/1999, 02/19/2003   HIB (PRP-OMP) 05/22/1998, 07/09/1998, 12/17/1999   HPV Quadrivalent 12/24/2011, 01/08/2012, 11/14/2013   Hepatitis A 08/06/2008, 12/24/2011   Hepatitis B 05/22/1998, 07/09/1998, 10/31/1998   IPV 05/22/1998, 07/09/1998, 04/17/1999, 02/19/2003   Influenza Nasal 01/08/2012   Influenza,inj,Quad PF,6+ Mos 01/05/2019, 02/05/2021   MMR 04/17/1999, 02/19/2003   Meningococcal B, OMV 11/05/2015, 12/16/2015   Meningococcal Conjugate 11/14/2013   PFIZER(Purple Top)SARS-COV-2 Vaccination 06/16/2019, 07/07/2019, 05/30/2020   Pneumococcal Conjugate-13 10/31/1998, 04/17/1999, 12/17/1999   Tdap 10/18/2009, 11/13/2020    Screening for cancer: Colon cancer screening: Age 28  Breast cancer screening: You should perform a self breast exam monthly.   We reviewed recommendations for regular mammograms and breast cancer screening.  Cervical cancer screening: We reviewed recommendations for pap smear screening.   Skin cancer  screening: Check your skin regularly for new changes, growing lesions, or other lesions of concern Come in for evaluation if you have skin lesions of concern.  Lung cancer screening: If you have a greater than 20 pack year history of tobacco use, then you may qualify for lung cancer screening with a chest CT scan.   Please call your insurance company to inquire about coverage for this test.  We currently don't have screenings for other cancers besides breast, cervical, colon, and lung cancers.  If you have a strong family history of cancer or have other cancer screening concerns, please let me know.    Bone health: Get at least 150 minutes of aerobic exercise weekly Get  weight bearing exercise at least once weekly Bone density test:  A bone density test is an imaging test that uses a type of X-ray to measure the amount of calcium and other minerals in your bones. The test may be used to diagnose or screen you for a condition that causes weak or thin bones (osteoporosis), predict your risk for a broken bone (fracture), or determine how well your osteoporosis treatment is working. The bone density test is recommended for females 65 and older, or females or males <65 if certain risk factors such as thyroid disease, long term use of steroids such as for asthma or rheumatological issues, vitamin D deficiency, estrogen deficiency, family history of osteoporosis, self or family history of fragility fracture in first degree relative.    Heart health: Get at least 150 minutes of aerobic exercise weekly Limit alcohol It is important to maintain a healthy blood pressure and healthy cholesterol numbers  Heart disease screening: Screening for heart disease includes screening for blood pressure, fasting lipids, glucose/diabetes screening, BMI height to weight ratio, reviewed of smoking status, physical activity, and diet.    Goals include blood pressure 120/80 or less, maintaining a healthy lipid/cholesterol profile, preventing diabetes or keeping diabetes numbers under good control, not smoking or using tobacco products, exercising most days per week or at least 150 minutes per week of exercise, and eating healthy variety of fruits and vegetables, healthy oils, and avoiding unhealthy food choices like fried food, fast food, high sugar and high cholesterol foods.    Other tests may possibly include EKG test, CT coronary calcium score, echocardiogram, exercise treadmill stress test.     Medical care options: I recommend you continue to seek care here first for routine care.  We try really hard to have available appointments Monday through Friday daytime hours for sick  visits, acute visits, and physicals.  Urgent care should be used for after hours and weekends for significant issues that cannot wait till the next day.  The emergency department should be used for significant potentially life-threatening emergencies.  The emergency department is expensive, can often have long wait times for less significant concerns, so try to utilize primary care, urgent care, or telemedicine when possible to avoid unnecessary trips to the emergency department.  Virtual visits and telemedicine have been introduced since the pandemic started in 2020, and can be convenient ways to receive medical care.  We offer virtual appointments as well to assist you in a variety of options to seek medical care.    Separate significant issues discussed: ADD-doing fine on current dosing of methylphenidate  BMI greater than 40  -  work on healthy lifestyle, regular exercise, low-fat diet in efforts to lose weight  Advised she consider birth control and strict condom use   Aimee Duncan  was seen today for nonfasting cpe.  Diagnoses and all orders for this visit:  Encounter for health maintenance examination in adult -     Comprehensive metabolic panel; Future -     CBC; Future -     Lipid panel; Future -     Hemoglobin A1c; Future -     TSH; Future -     RPR+HIV+GC+CT Panel; Future -     Hepatitis C antibody; Future -     Hepatitis B surface antigen; Future  Screening for thyroid disorder -     TSH; Future  Screening for diabetes mellitus -     Hemoglobin A1c; Future  Screen for STD (sexually transmitted disease) -     RPR+HIV+GC+CT Panel; Future -     Hepatitis C antibody; Future -     Hepatitis B surface antigen; Future  Depressed mood  Attention deficit hyperactivity disorder (ADHD), unspecified ADHD type  Anxiety  Screening for lipid disorders -     Lipid panel; Future  Other orders -     buPROPion (WELLBUTRIN XL) 150 MG 24 hr tablet; Take 1 tablet (150 mg total) by mouth  daily. -     methylphenidate (RITALIN) 10 MG tablet; 1-1.5 tablet at lunch daily, 1/2 tablet QHS if needed   Follow-up pending labs, yearly for physical

## 2022-07-21 ENCOUNTER — Other Ambulatory Visit: Payer: 59

## 2022-07-21 DIAGNOSIS — Z131 Encounter for screening for diabetes mellitus: Secondary | ICD-10-CM | POA: Diagnosis not present

## 2022-07-21 DIAGNOSIS — Z13220 Encounter for screening for lipoid disorders: Secondary | ICD-10-CM

## 2022-07-21 DIAGNOSIS — Z0000 Encounter for general adult medical examination without abnormal findings: Secondary | ICD-10-CM | POA: Diagnosis not present

## 2022-07-21 DIAGNOSIS — Z1329 Encounter for screening for other suspected endocrine disorder: Secondary | ICD-10-CM | POA: Diagnosis not present

## 2022-07-21 DIAGNOSIS — Z113 Encounter for screening for infections with a predominantly sexual mode of transmission: Secondary | ICD-10-CM

## 2022-07-21 LAB — LIPID PANEL

## 2022-07-22 NOTE — Progress Notes (Signed)
Results sent through MyChart

## 2022-07-23 LAB — COMPREHENSIVE METABOLIC PANEL
ALT: 17 IU/L (ref 0–32)
AST: 29 IU/L (ref 0–40)
Albumin/Globulin Ratio: 1.5 (ref 1.2–2.2)
Albumin: 4.1 g/dL (ref 4.0–5.0)
Alkaline Phosphatase: 104 IU/L (ref 44–121)
BUN/Creatinine Ratio: 13 (ref 9–23)
BUN: 9 mg/dL (ref 6–20)
Bilirubin Total: 0.5 mg/dL (ref 0.0–1.2)
CO2: 21 mmol/L (ref 20–29)
Calcium: 9.4 mg/dL (ref 8.7–10.2)
Chloride: 103 mmol/L (ref 96–106)
Creatinine, Ser: 0.69 mg/dL (ref 0.57–1.00)
Globulin, Total: 2.8 g/dL (ref 1.5–4.5)
Glucose: 96 mg/dL (ref 70–99)
Potassium: 4.3 mmol/L (ref 3.5–5.2)
Sodium: 137 mmol/L (ref 134–144)
Total Protein: 6.9 g/dL (ref 6.0–8.5)
eGFR: 124 mL/min/{1.73_m2} (ref >59)

## 2022-07-23 LAB — CBC
Hematocrit: 37.6 % (ref 34.0–46.6)
Hemoglobin: 12.2 g/dL (ref 11.1–15.9)
MCH: 28.8 pg (ref 26.6–33.0)
MCHC: 32.4 g/dL (ref 31.5–35.7)
MCV: 89 fL (ref 79–97)
Platelets: 291 10*3/uL (ref 150–450)
RBC: 4.23 x10E6/uL (ref 3.77–5.28)
RDW: 11.6 % — ABNORMAL LOW (ref 11.7–15.4)
WBC: 6 10*3/uL (ref 3.4–10.8)

## 2022-07-23 LAB — RPR+HIV+GC+CT PANEL
Chlamydia trachomatis, NAA: NEGATIVE
HIV Screen 4th Generation wRfx: NONREACTIVE
Neisseria Gonorrhoeae by PCR: NEGATIVE
RPR Ser Ql: NONREACTIVE

## 2022-07-23 LAB — TSH: TSH: 1.99 u[IU]/mL (ref 0.450–4.500)

## 2022-07-23 LAB — HEMOGLOBIN A1C
Est. average glucose Bld gHb Est-mCnc: 103 mg/dL
Hgb A1c MFr Bld: 5.2 % (ref 4.8–5.6)

## 2022-07-23 LAB — LIPID PANEL
Chol/HDL Ratio: 2.4 ratio (ref 0.0–4.4)
Cholesterol, Total: 139 mg/dL (ref 100–199)
HDL: 57 mg/dL (ref >39)
LDL Chol Calc (NIH): 70 mg/dL (ref 0–99)
Triglycerides: 53 mg/dL (ref 0–149)
VLDL Cholesterol Cal: 12 mg/dL (ref 5–40)

## 2022-07-23 LAB — HEPATITIS C ANTIBODY: Hep C Virus Ab: NONREACTIVE

## 2022-07-23 LAB — HEPATITIS B SURFACE ANTIGEN: Hepatitis B Surface Ag: NEGATIVE

## 2022-07-23 NOTE — Progress Notes (Signed)
Results sent through MyChart

## 2022-07-24 DIAGNOSIS — F900 Attention-deficit hyperactivity disorder, predominantly inattentive type: Secondary | ICD-10-CM | POA: Diagnosis not present

## 2022-08-07 DIAGNOSIS — F900 Attention-deficit hyperactivity disorder, predominantly inattentive type: Secondary | ICD-10-CM | POA: Diagnosis not present

## 2022-09-07 DIAGNOSIS — F900 Attention-deficit hyperactivity disorder, predominantly inattentive type: Secondary | ICD-10-CM | POA: Diagnosis not present

## 2022-10-01 DIAGNOSIS — F900 Attention-deficit hyperactivity disorder, predominantly inattentive type: Secondary | ICD-10-CM | POA: Diagnosis not present

## 2022-11-06 DIAGNOSIS — Z4789 Encounter for other orthopedic aftercare: Secondary | ICD-10-CM | POA: Diagnosis not present

## 2022-11-26 DIAGNOSIS — S82841D Displaced bimalleolar fracture of right lower leg, subsequent encounter for closed fracture with routine healing: Secondary | ICD-10-CM | POA: Diagnosis not present

## 2022-11-28 DIAGNOSIS — M25671 Stiffness of right ankle, not elsewhere classified: Secondary | ICD-10-CM | POA: Diagnosis not present

## 2022-11-28 DIAGNOSIS — R29898 Other symptoms and signs involving the musculoskeletal system: Secondary | ICD-10-CM | POA: Diagnosis not present

## 2022-11-28 DIAGNOSIS — Z9889 Other specified postprocedural states: Secondary | ICD-10-CM | POA: Diagnosis not present

## 2022-11-28 DIAGNOSIS — Z7409 Other reduced mobility: Secondary | ICD-10-CM | POA: Diagnosis not present

## 2022-12-01 DIAGNOSIS — Z9889 Other specified postprocedural states: Secondary | ICD-10-CM | POA: Diagnosis not present

## 2022-12-01 DIAGNOSIS — R29898 Other symptoms and signs involving the musculoskeletal system: Secondary | ICD-10-CM | POA: Diagnosis not present

## 2022-12-01 DIAGNOSIS — Z7409 Other reduced mobility: Secondary | ICD-10-CM | POA: Diagnosis not present

## 2022-12-01 DIAGNOSIS — M25671 Stiffness of right ankle, not elsewhere classified: Secondary | ICD-10-CM | POA: Diagnosis not present

## 2022-12-03 DIAGNOSIS — Z9889 Other specified postprocedural states: Secondary | ICD-10-CM | POA: Diagnosis not present

## 2022-12-03 DIAGNOSIS — Z7409 Other reduced mobility: Secondary | ICD-10-CM | POA: Diagnosis not present

## 2022-12-03 DIAGNOSIS — M25671 Stiffness of right ankle, not elsewhere classified: Secondary | ICD-10-CM | POA: Diagnosis not present

## 2022-12-03 DIAGNOSIS — R29898 Other symptoms and signs involving the musculoskeletal system: Secondary | ICD-10-CM | POA: Diagnosis not present

## 2022-12-10 DIAGNOSIS — R29898 Other symptoms and signs involving the musculoskeletal system: Secondary | ICD-10-CM | POA: Diagnosis not present

## 2022-12-10 DIAGNOSIS — M25671 Stiffness of right ankle, not elsewhere classified: Secondary | ICD-10-CM | POA: Diagnosis not present

## 2022-12-10 DIAGNOSIS — Z7409 Other reduced mobility: Secondary | ICD-10-CM | POA: Diagnosis not present

## 2022-12-18 DIAGNOSIS — R29898 Other symptoms and signs involving the musculoskeletal system: Secondary | ICD-10-CM | POA: Diagnosis not present

## 2022-12-18 DIAGNOSIS — Z7409 Other reduced mobility: Secondary | ICD-10-CM | POA: Diagnosis not present

## 2022-12-18 DIAGNOSIS — M25671 Stiffness of right ankle, not elsewhere classified: Secondary | ICD-10-CM | POA: Diagnosis not present

## 2022-12-19 DIAGNOSIS — R29898 Other symptoms and signs involving the musculoskeletal system: Secondary | ICD-10-CM | POA: Diagnosis not present

## 2022-12-19 DIAGNOSIS — M25671 Stiffness of right ankle, not elsewhere classified: Secondary | ICD-10-CM | POA: Diagnosis not present

## 2022-12-19 DIAGNOSIS — Z7409 Other reduced mobility: Secondary | ICD-10-CM | POA: Diagnosis not present

## 2022-12-19 DIAGNOSIS — Z9889 Other specified postprocedural states: Secondary | ICD-10-CM | POA: Diagnosis not present

## 2022-12-22 DIAGNOSIS — R29898 Other symptoms and signs involving the musculoskeletal system: Secondary | ICD-10-CM | POA: Diagnosis not present

## 2022-12-22 DIAGNOSIS — Z9889 Other specified postprocedural states: Secondary | ICD-10-CM | POA: Diagnosis not present

## 2022-12-22 DIAGNOSIS — M25671 Stiffness of right ankle, not elsewhere classified: Secondary | ICD-10-CM | POA: Diagnosis not present

## 2022-12-22 DIAGNOSIS — Z7409 Other reduced mobility: Secondary | ICD-10-CM | POA: Diagnosis not present

## 2022-12-26 DIAGNOSIS — M25671 Stiffness of right ankle, not elsewhere classified: Secondary | ICD-10-CM | POA: Diagnosis not present

## 2022-12-26 DIAGNOSIS — R29898 Other symptoms and signs involving the musculoskeletal system: Secondary | ICD-10-CM | POA: Diagnosis not present

## 2022-12-26 DIAGNOSIS — Z7409 Other reduced mobility: Secondary | ICD-10-CM | POA: Diagnosis not present

## 2022-12-26 DIAGNOSIS — Z9889 Other specified postprocedural states: Secondary | ICD-10-CM | POA: Diagnosis not present

## 2023-01-09 DIAGNOSIS — R29898 Other symptoms and signs involving the musculoskeletal system: Secondary | ICD-10-CM | POA: Diagnosis not present

## 2023-01-09 DIAGNOSIS — M25671 Stiffness of right ankle, not elsewhere classified: Secondary | ICD-10-CM | POA: Diagnosis not present

## 2023-01-09 DIAGNOSIS — Z9889 Other specified postprocedural states: Secondary | ICD-10-CM | POA: Diagnosis not present

## 2023-01-09 DIAGNOSIS — Z7409 Other reduced mobility: Secondary | ICD-10-CM | POA: Diagnosis not present

## 2023-01-14 DIAGNOSIS — S82841D Displaced bimalleolar fracture of right lower leg, subsequent encounter for closed fracture with routine healing: Secondary | ICD-10-CM | POA: Diagnosis not present

## 2023-01-16 DIAGNOSIS — Z9889 Other specified postprocedural states: Secondary | ICD-10-CM | POA: Diagnosis not present

## 2023-01-16 DIAGNOSIS — Z7409 Other reduced mobility: Secondary | ICD-10-CM | POA: Diagnosis not present

## 2023-01-16 DIAGNOSIS — R29898 Other symptoms and signs involving the musculoskeletal system: Secondary | ICD-10-CM | POA: Diagnosis not present

## 2023-01-16 DIAGNOSIS — M25671 Stiffness of right ankle, not elsewhere classified: Secondary | ICD-10-CM | POA: Diagnosis not present

## 2023-01-19 DIAGNOSIS — M25671 Stiffness of right ankle, not elsewhere classified: Secondary | ICD-10-CM | POA: Diagnosis not present

## 2023-01-19 DIAGNOSIS — Z9889 Other specified postprocedural states: Secondary | ICD-10-CM | POA: Diagnosis not present

## 2023-01-19 DIAGNOSIS — Z7409 Other reduced mobility: Secondary | ICD-10-CM | POA: Diagnosis not present

## 2023-01-19 DIAGNOSIS — R29898 Other symptoms and signs involving the musculoskeletal system: Secondary | ICD-10-CM | POA: Diagnosis not present

## 2023-01-27 DIAGNOSIS — F900 Attention-deficit hyperactivity disorder, predominantly inattentive type: Secondary | ICD-10-CM | POA: Diagnosis not present

## 2023-01-29 IMAGING — CT CT ABD-PELV W/ CM
2 of 4 series · 17 of 46 positions shown, 19 images · IV contrast (agent unspecified)
Comparison: None.

CLINICAL DATA: Right lower quadrant pain

EXAM:
CT ABDOMEN AND PELVIS WITH CONTRAST
TECHNIQUE: Multidetector CT imaging of the abdomen and pelvis was performed
using the standard protocol following bolus administration of
intravenous contrast.

[Series 3: abdomen 5.0 · axial · 0.96mm/px · z∈[-1038,-648]mm · 14 of 88 slices shown, 16 images]
[im 5/88  soft-tissue]
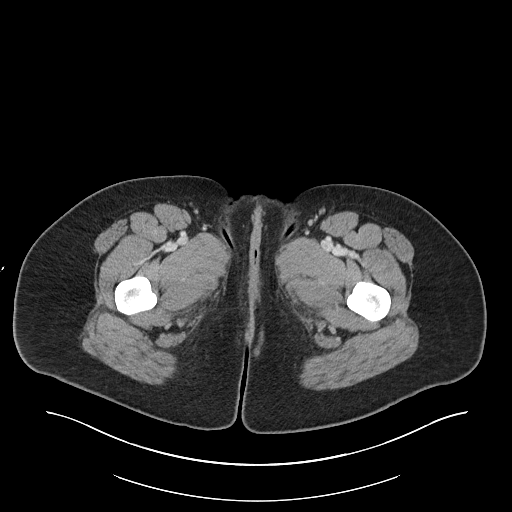
[im 5/88  bone]
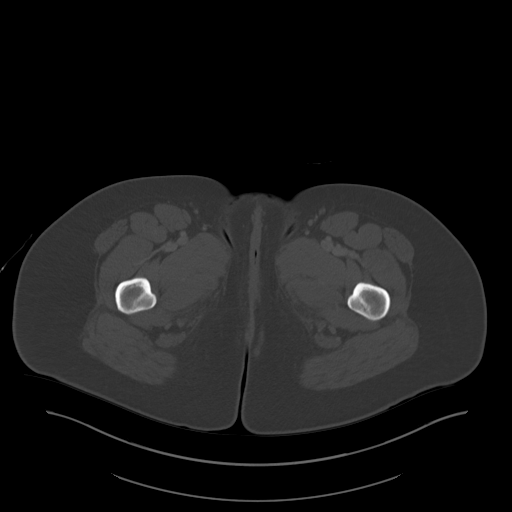
[im 14/88  soft-tissue]
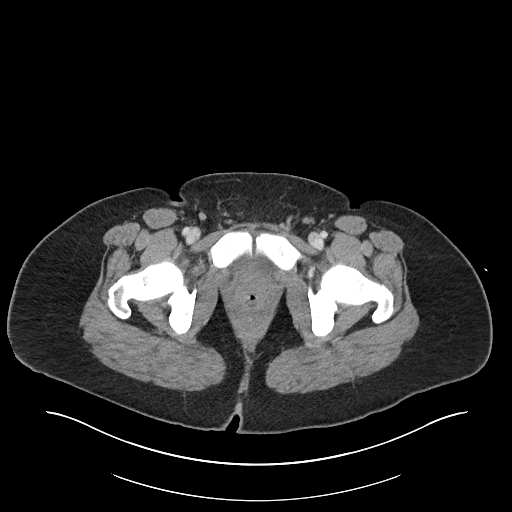
[im 18/88  soft-tissue]
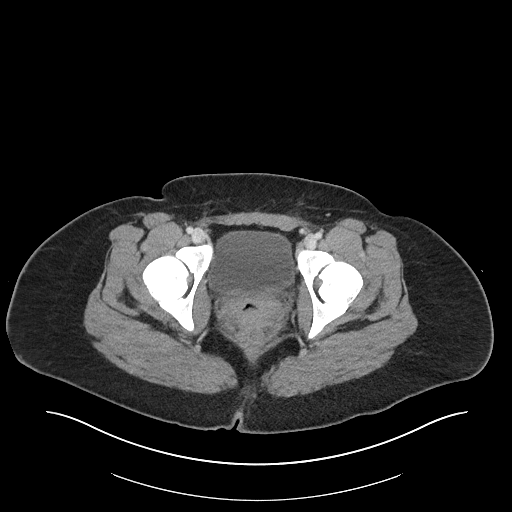
[im 22/88  soft-tissue]
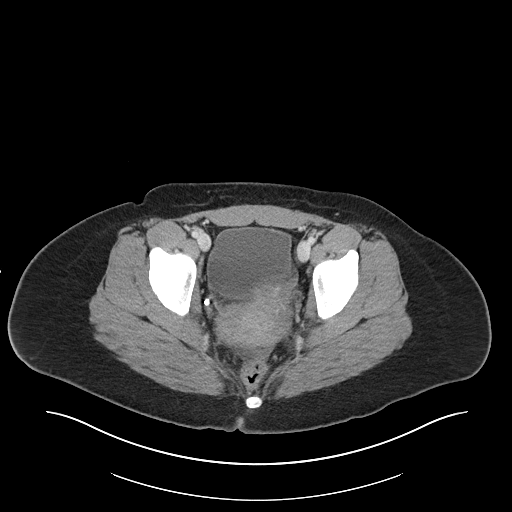
[im 31/88  soft-tissue]
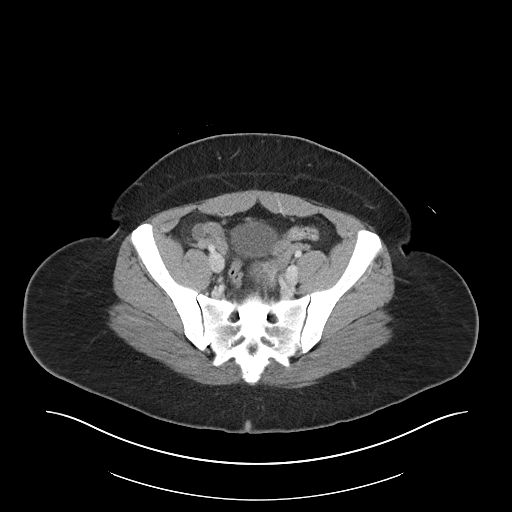
[im 35/88  soft-tissue]
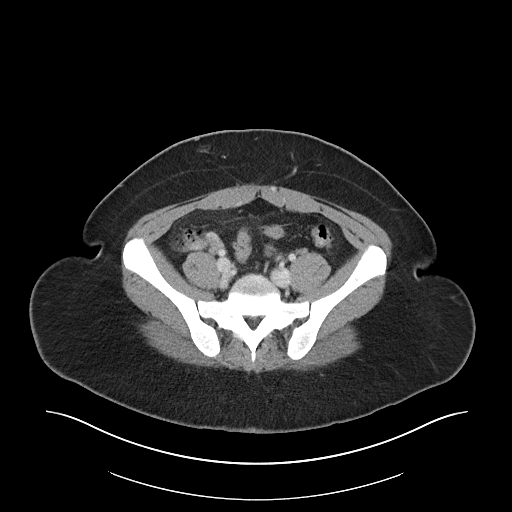
[im 40/88  soft-tissue]
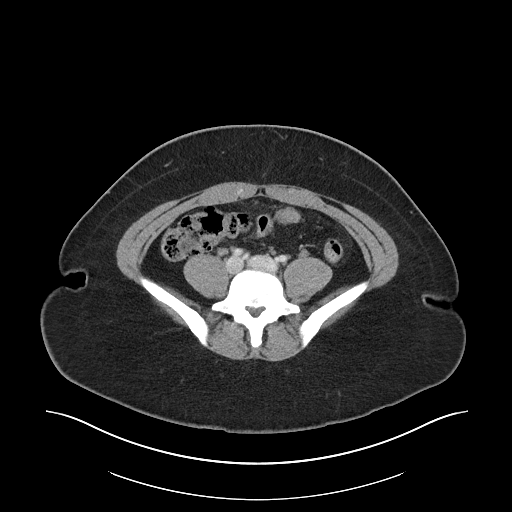
[im 48/88  soft-tissue]
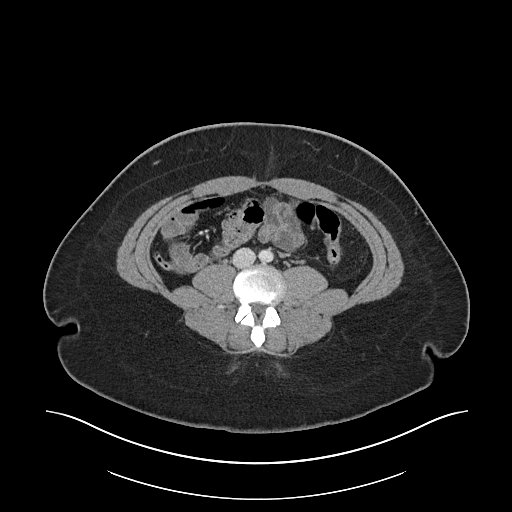
[im 53/88  soft-tissue]
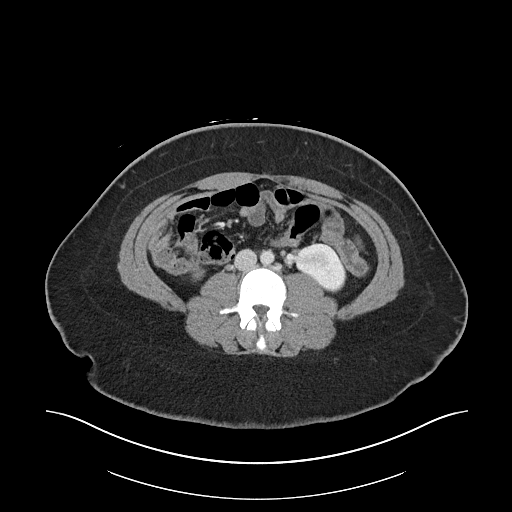
[im 53/88  bone]
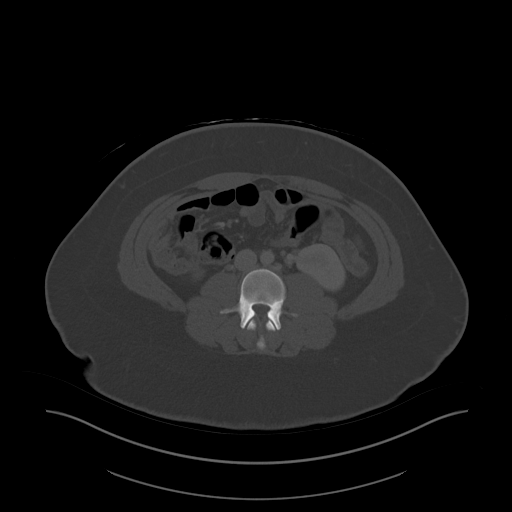
[im 57/88  soft-tissue]
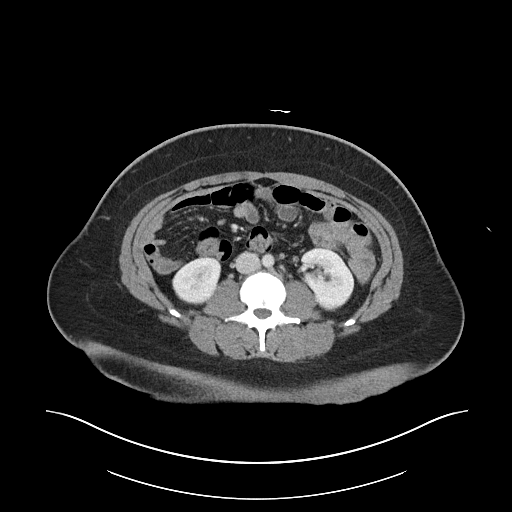
[im 66/88  soft-tissue]
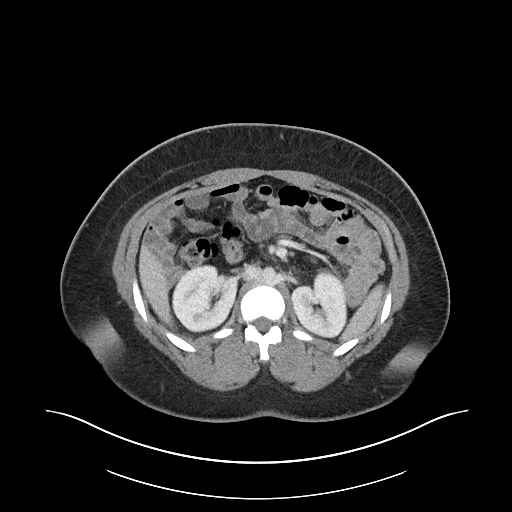
[im 70/88  soft-tissue]
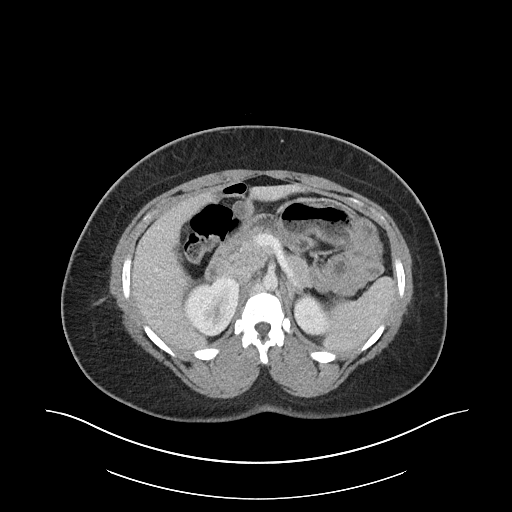
[im 74/88  soft-tissue]
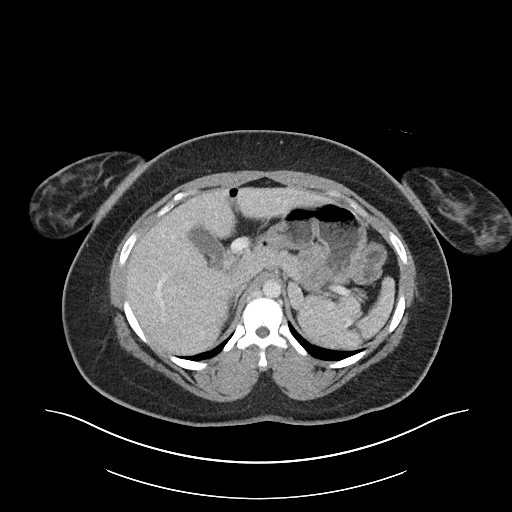
[im 83/88  soft-tissue]
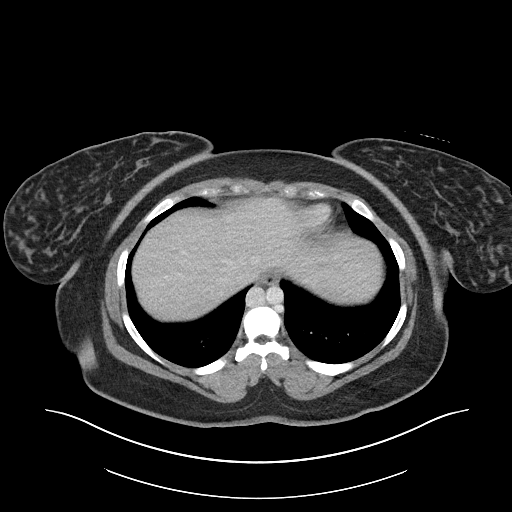

[Series 6: abdomen 3.0 mpr cor · coronal · 0.89mm/px · 3 of 101 slices shown]
[im 34/101  soft-tissue]
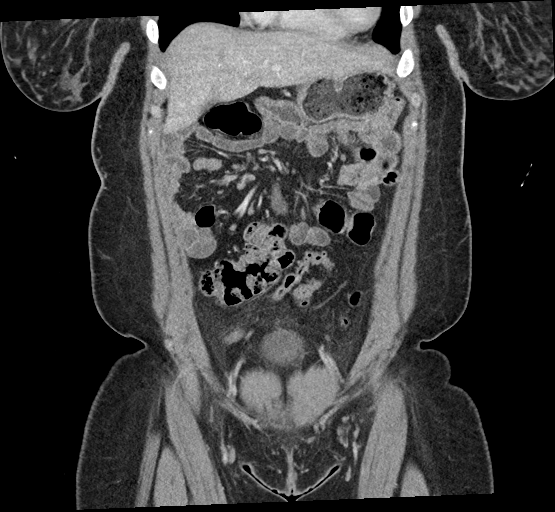
[im 45/101  soft-tissue]
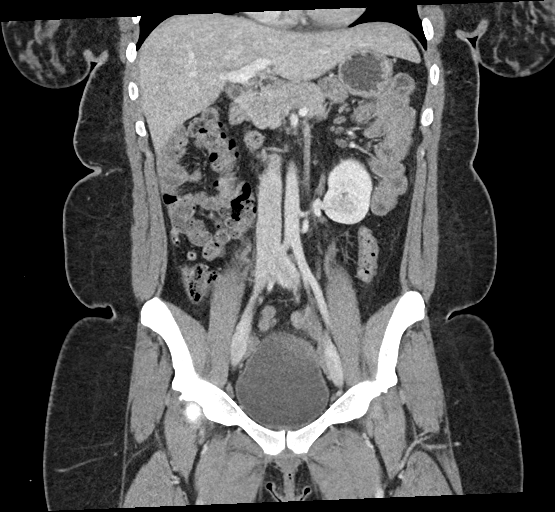
[im 56/101  soft-tissue]
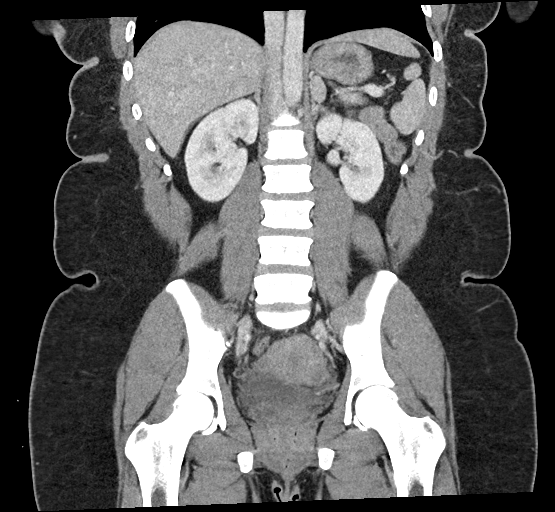

[17 of 46 positions shown; findings below may reference images not displayed]

RADIATION DOSE REDUCTION: This exam was performed according to the
departmental dose-optimization program which includes automated
exposure control, adjustment of the mA and/or kV according to
patient size and/or use of iterative reconstruction technique.

CONTRAST:  100mL OMNIPAQUE IOHEXOL 300 MG/ML  SOLN
FINDINGS: Lower chest: No acute abnormality

Hepatobiliary: No focal hepatic abnormality. Gallbladder
unremarkable.

Pancreas: No focal abnormality or ductal dilatation.

Spleen: No focal abnormality.  Normal size.

Adrenals/Urinary Tract: No adrenal abnormality. No focal renal
abnormality. No stones or hydronephrosis. Urinary bladder is
unremarkable.

Stomach/Bowel: Stomach, large and small bowel grossly unremarkable.
Normal appendix.

Vascular/Lymphatic: No evidence of aneurysm or adenopathy.

Reproductive: Uterus and adnexa unremarkable.  No mass.

Other: No free fluid or free air.

Musculoskeletal: No acute bony abnormality.
IMPRESSION: Normal appendix.

No acute findings in the abdomen or pelvis.

## 2023-01-29 IMAGING — US US PELVIS COMPLETE
1 series · 14 of 25 positions shown · non-contrast
Comparison: CT earlier today

CLINICAL DATA: Right lower quadrant pain

EXAM:
TRANSABDOMINAL ULTRASOUND OF PELVIS
DOPPLER ULTRASOUND OF OVARIES
TECHNIQUE: Transabdominal ultrasound examination of the pelvis was performed
including evaluation of the uterus, ovaries, adnexal regions, and
pelvic cul-de-sac.
Color and duplex Doppler ultrasound was utilized to evaluate blood
flow to the ovaries.

[Series 1: us pelvic complete w transvaginal and torsion righ · 14 of 44 slices shown]
[im 1/44]
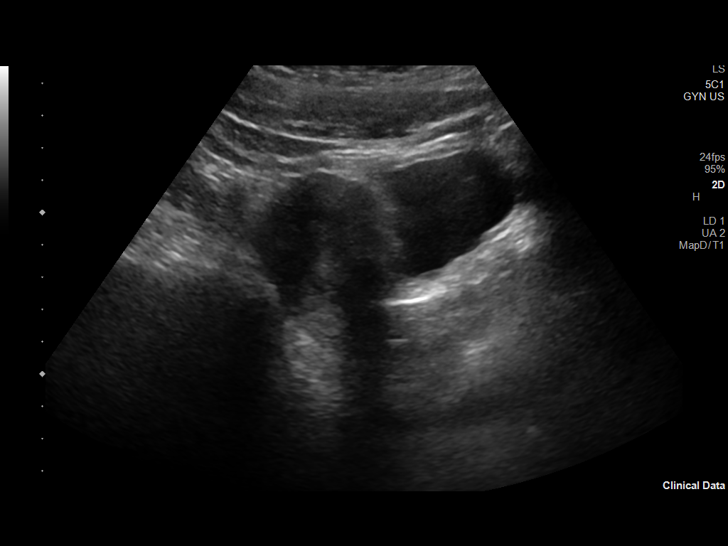
[im 4/44]
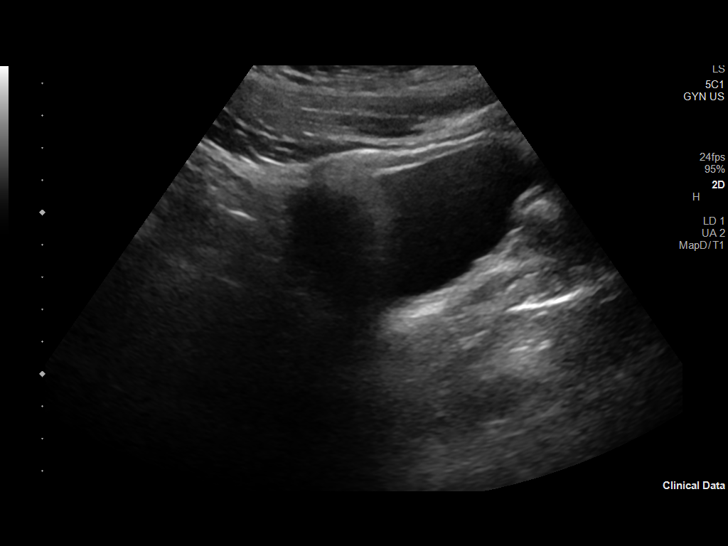
[im 8/44]
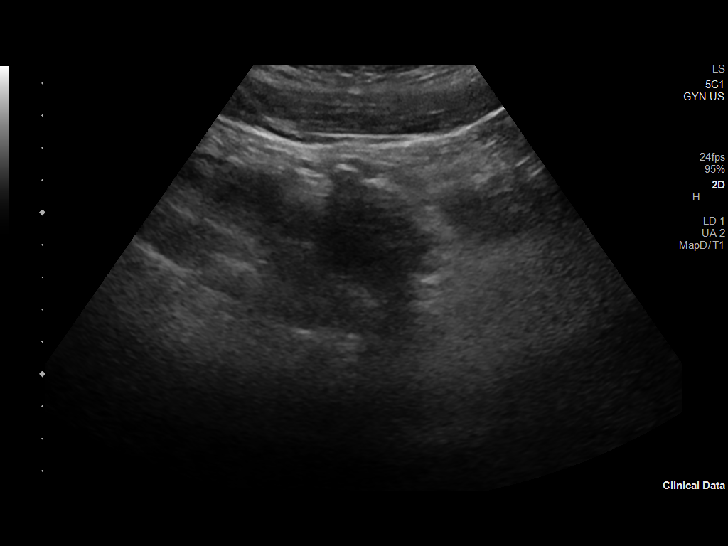
[im 11/44]
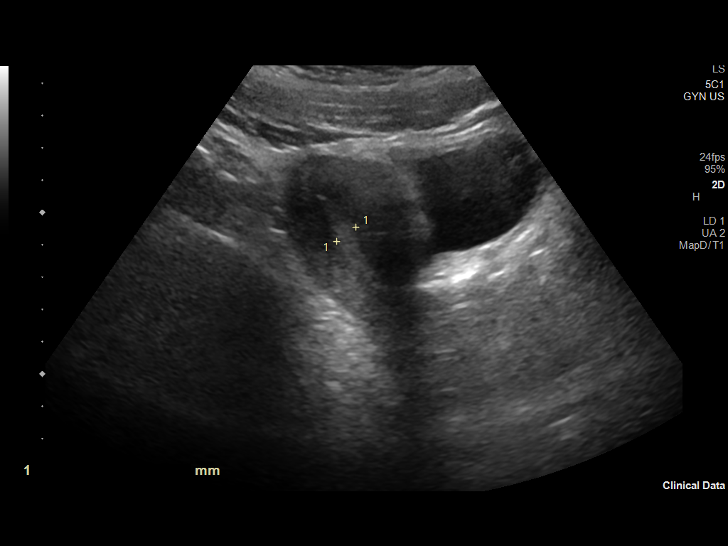
[im 15/44]
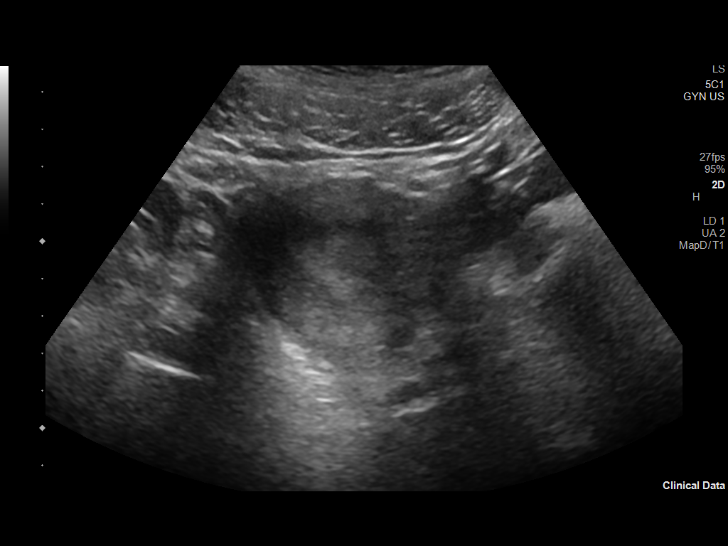
[im 17/44]
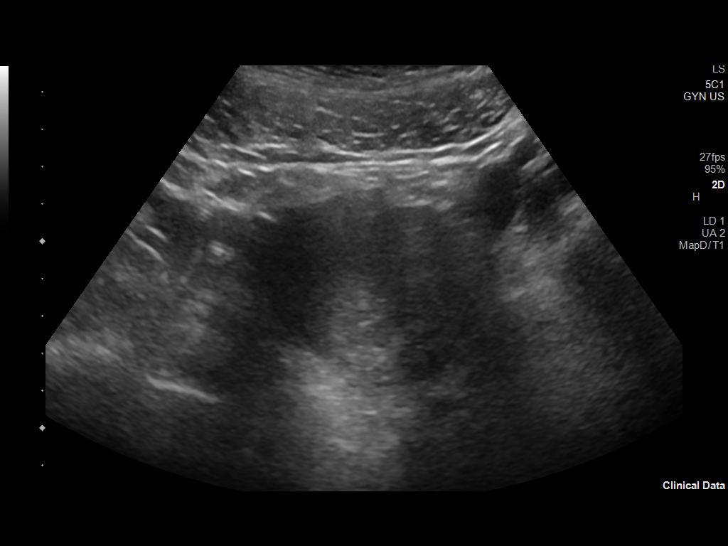
[im 20/44]
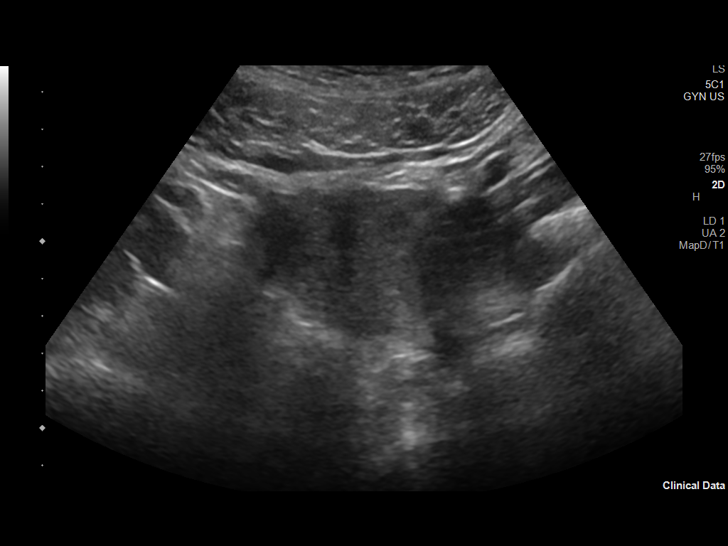
[im 24/44]
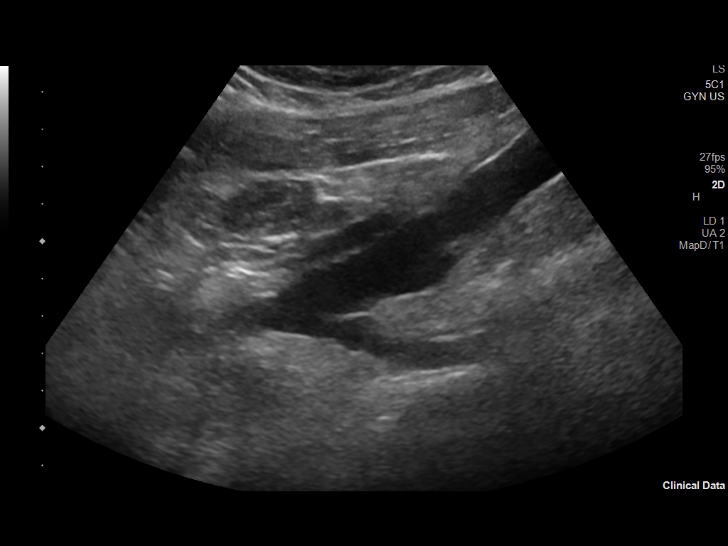
[im 27/44]
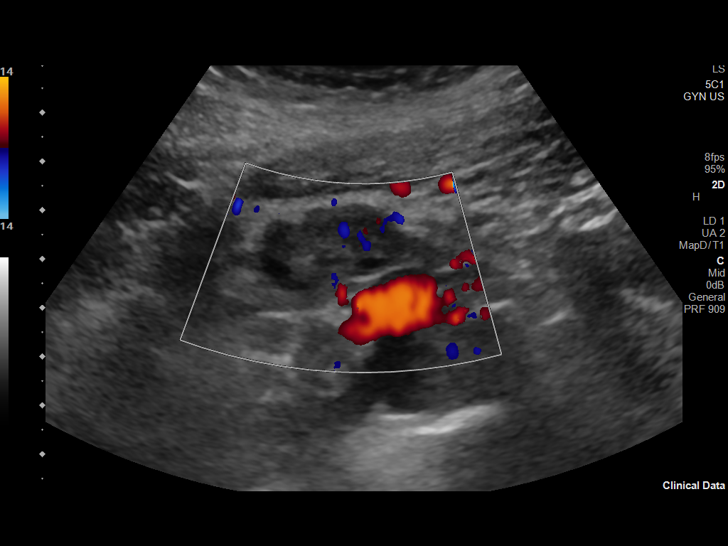
[im 29/44]
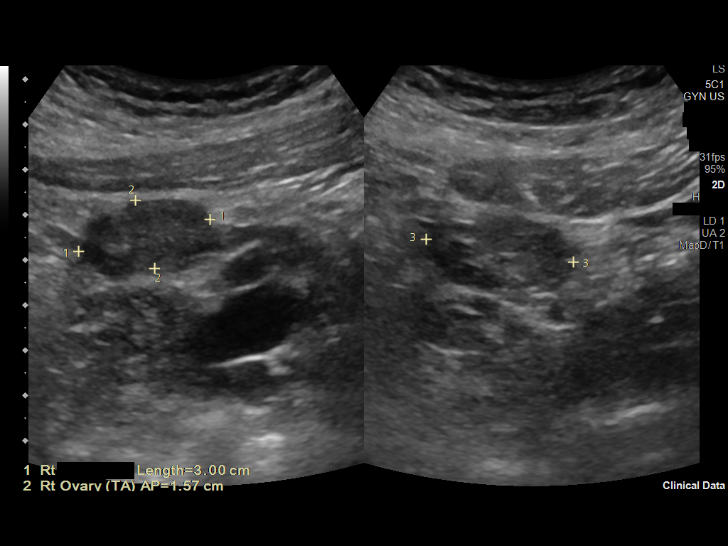
[im 33/44]
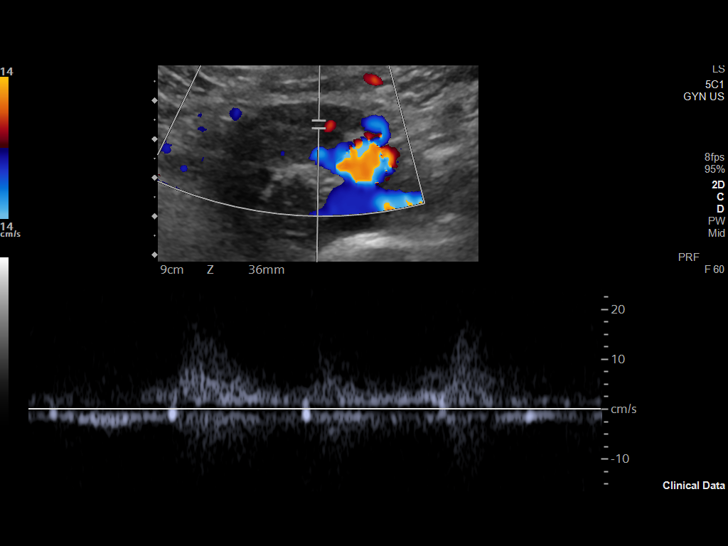
[im 36/44]
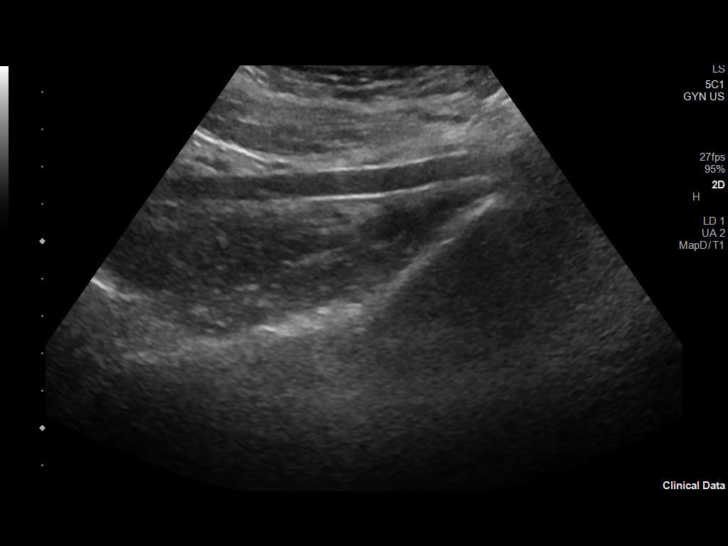
[im 40/44]
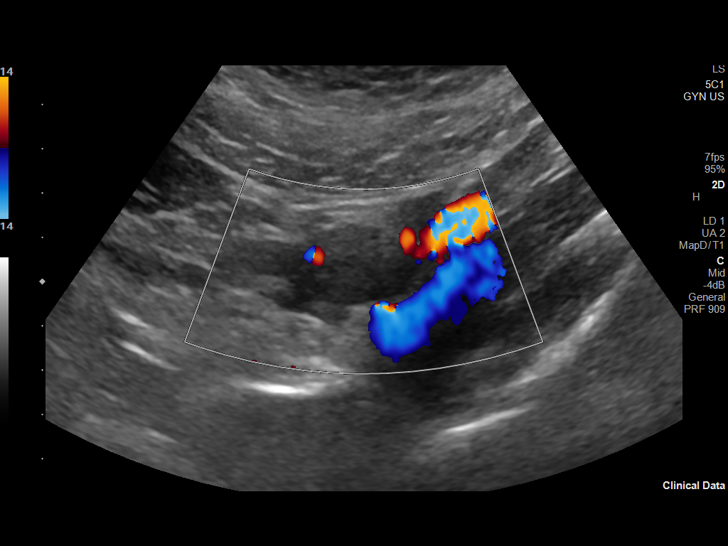
[im 44/44]
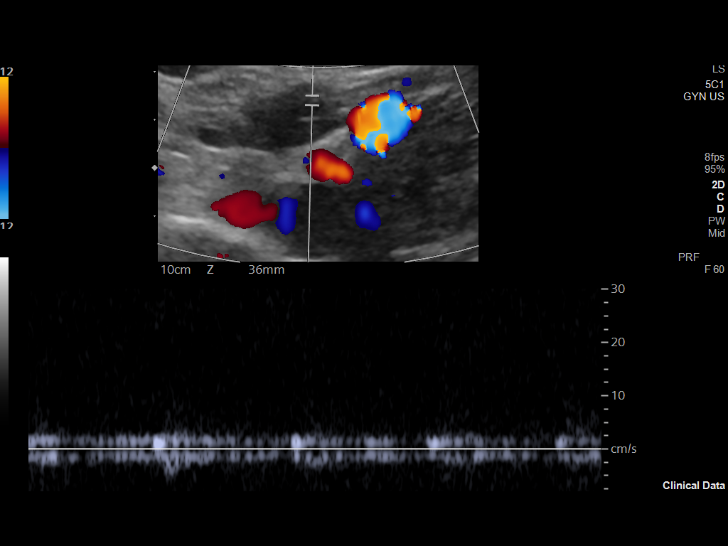

[14 of 25 positions shown; findings below may reference images not displayed]

FINDINGS: Uterus

Measurements: 7.1 x 4.2 x 5.2 cm = volume: 82 mL. No fibroids or
other mass visualized.

Endometrium

Thickness: 7 mm in thickness.  No focal abnormality visualized.

Right ovary

Measurements: 3.0 x 1.6 x 3.3 cm = volume: 8.1 mL. Normal
appearance/no adnexal mass.

Left ovary

Measurements: 3.2 x 1.8 x 3.3 cm = volume: 9.9 mL. Normal
appearance/no adnexal mass.

Pulsed Doppler evaluation demonstrates normal low-resistance
arterial and venous waveforms in both ovaries.

Other: No free fluid.  Patient refused transvaginal imaging.
IMPRESSION: No acute or significant abnormality. No evidence of ovarian mass or
torsion.

## 2023-01-29 IMAGING — US US ART/VEN ABD/PELV/SCROTUM DOPPLER LTD
1 series · 14 of 25 positions shown · non-contrast
Comparison: CT earlier today

CLINICAL DATA: Right lower quadrant pain

EXAM:
TRANSABDOMINAL ULTRASOUND OF PELVIS
DOPPLER ULTRASOUND OF OVARIES
TECHNIQUE: Transabdominal ultrasound examination of the pelvis was performed
including evaluation of the uterus, ovaries, adnexal regions, and
pelvic cul-de-sac.
Color and duplex Doppler ultrasound was utilized to evaluate blood
flow to the ovaries.

[Series 1: us pelvic complete w transvaginal and torsion righ · 14 of 44 slices shown]
[im 1/44]
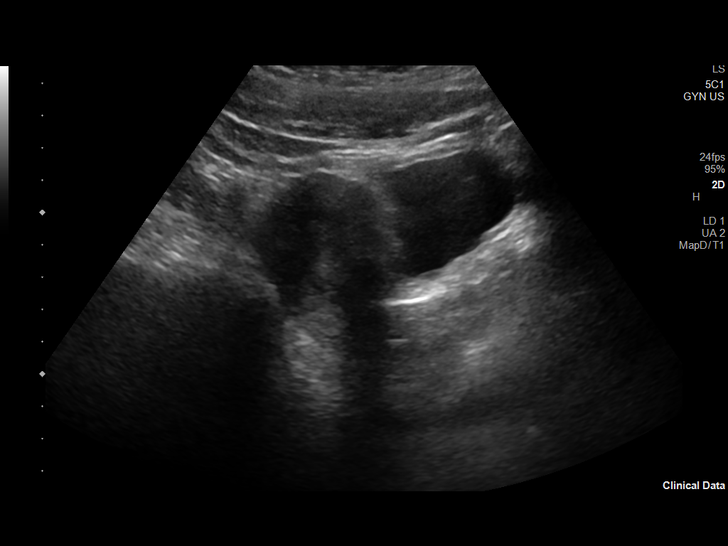
[im 4/44]
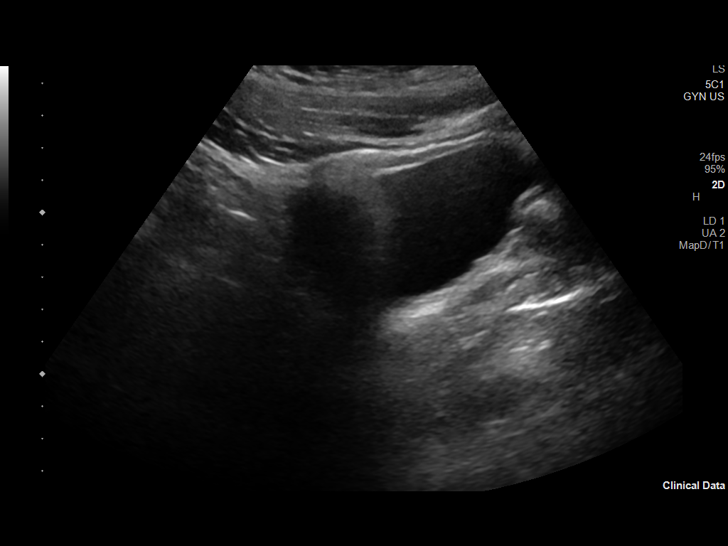
[im 8/44]
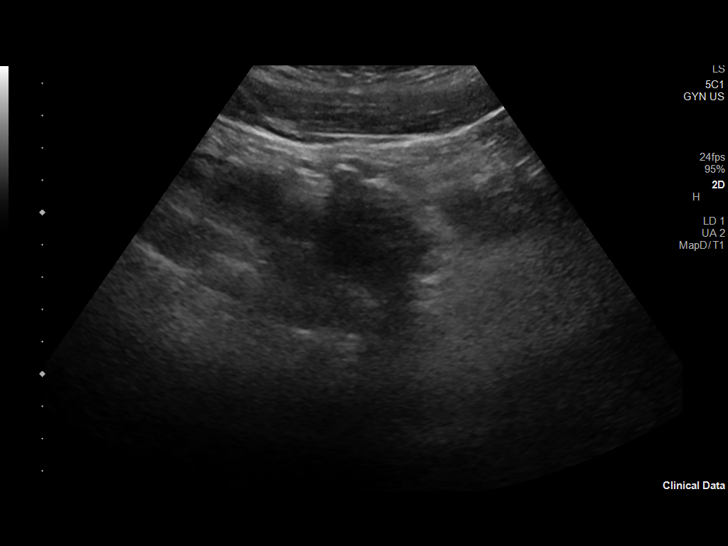
[im 11/44]
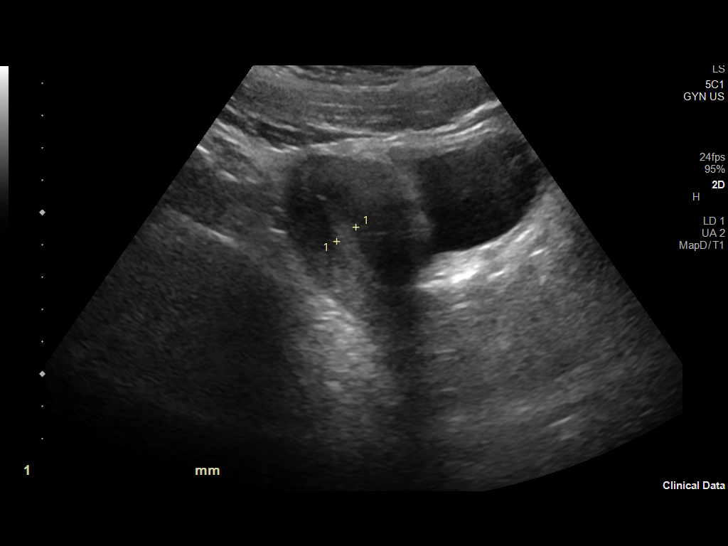
[im 15/44]
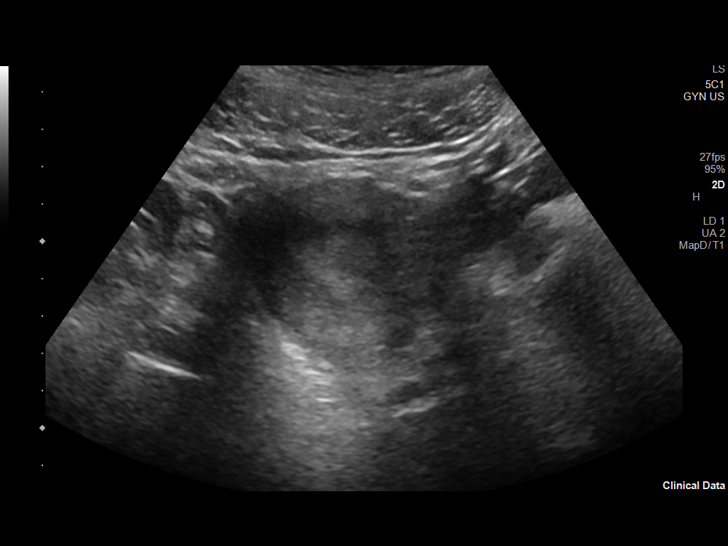
[im 17/44]
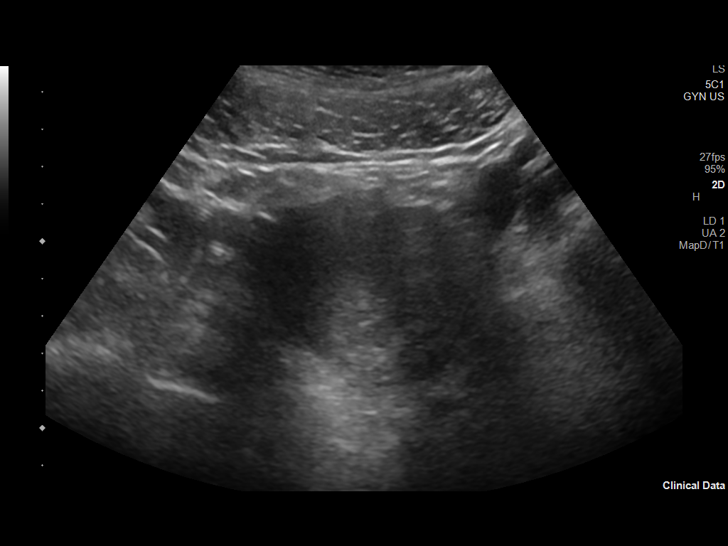
[im 20/44]
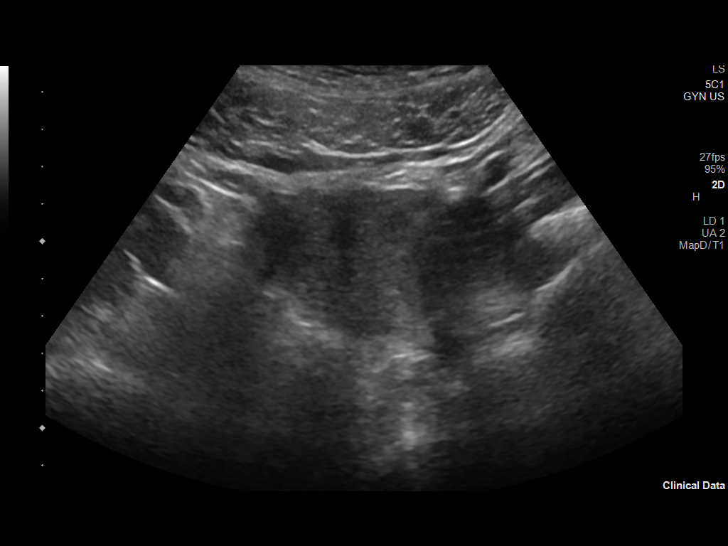
[im 24/44]
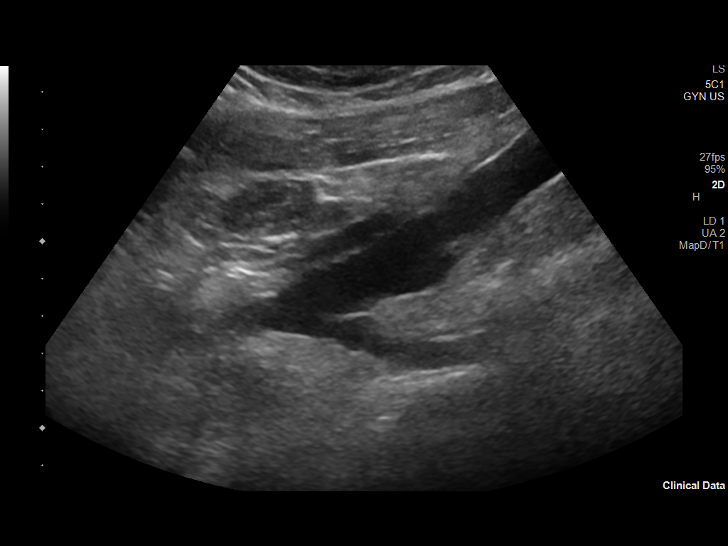
[im 27/44]
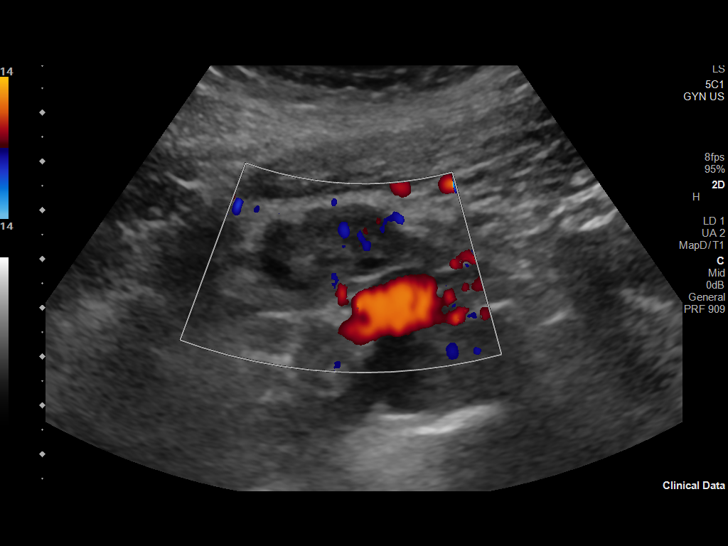
[im 29/44]
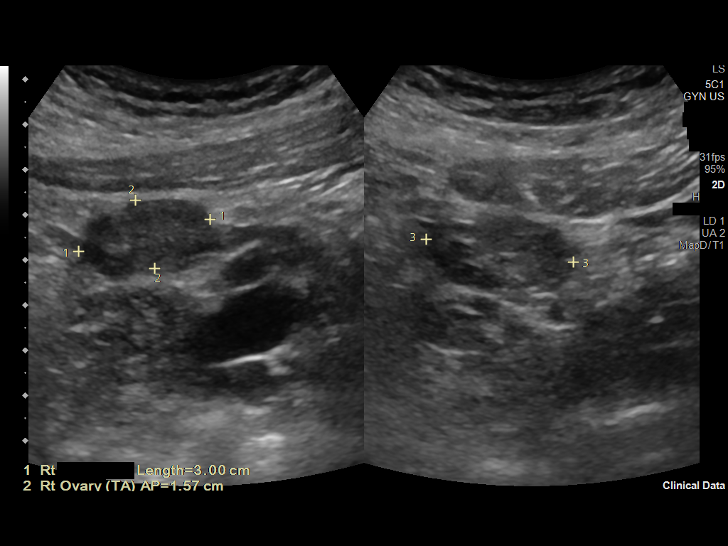
[im 33/44]
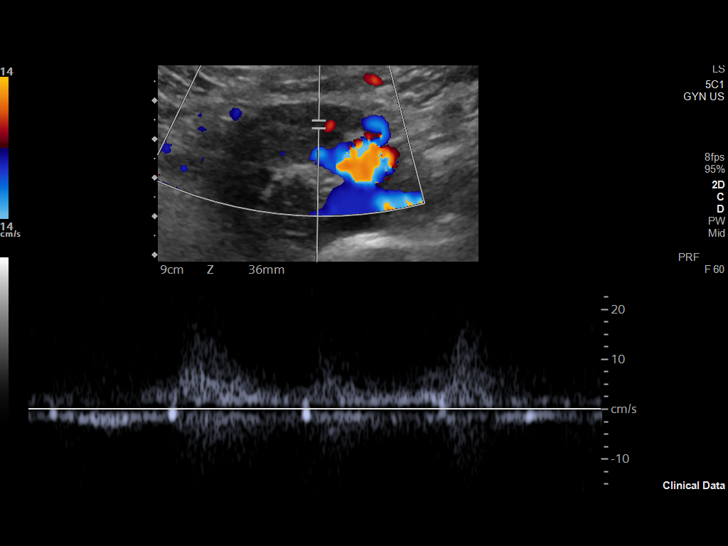
[im 36/44]
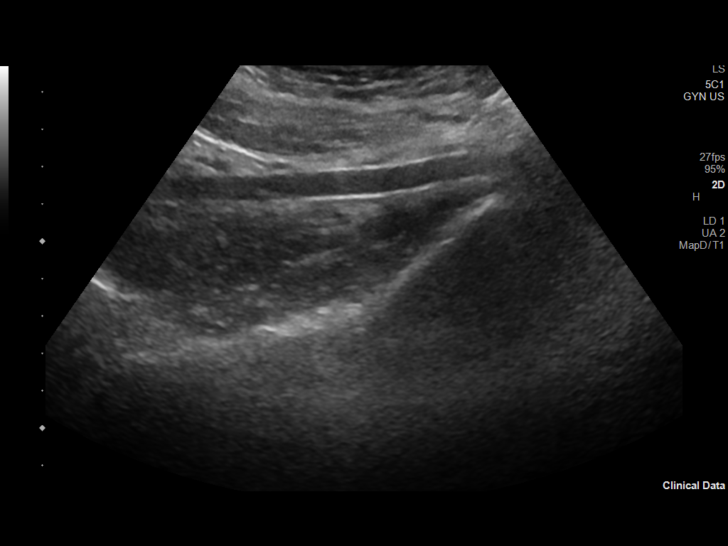
[im 40/44]
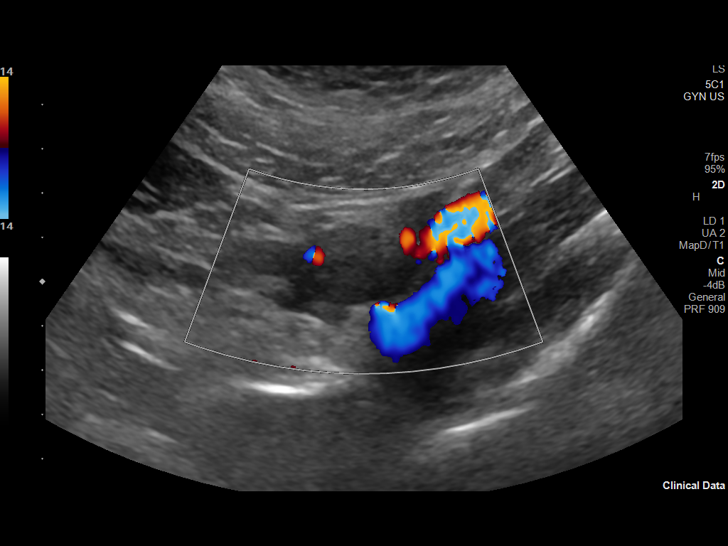
[im 44/44]
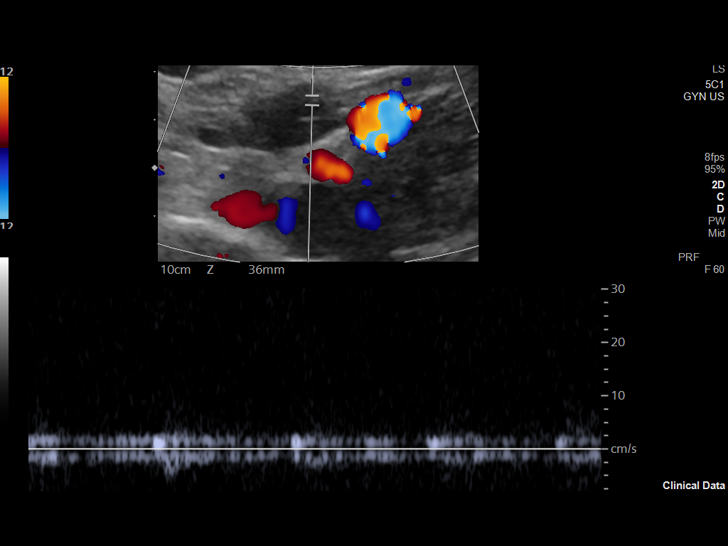

[14 of 25 positions shown; findings below may reference images not displayed]

FINDINGS: Uterus

Measurements: 7.1 x 4.2 x 5.2 cm = volume: 82 mL. No fibroids or
other mass visualized.

Endometrium

Thickness: 7 mm in thickness.  No focal abnormality visualized.

Right ovary

Measurements: 3.0 x 1.6 x 3.3 cm = volume: 8.1 mL. Normal
appearance/no adnexal mass.

Left ovary

Measurements: 3.2 x 1.8 x 3.3 cm = volume: 9.9 mL. Normal
appearance/no adnexal mass.

Pulsed Doppler evaluation demonstrates normal low-resistance
arterial and venous waveforms in both ovaries.

Other: No free fluid.  Patient refused transvaginal imaging.
IMPRESSION: No acute or significant abnormality. No evidence of ovarian mass or
torsion.

## 2023-02-11 DIAGNOSIS — R29898 Other symptoms and signs involving the musculoskeletal system: Secondary | ICD-10-CM | POA: Diagnosis not present

## 2023-02-11 DIAGNOSIS — M25671 Stiffness of right ankle, not elsewhere classified: Secondary | ICD-10-CM | POA: Diagnosis not present

## 2023-02-11 DIAGNOSIS — Z7409 Other reduced mobility: Secondary | ICD-10-CM | POA: Diagnosis not present

## 2023-02-17 DIAGNOSIS — R29898 Other symptoms and signs involving the musculoskeletal system: Secondary | ICD-10-CM | POA: Diagnosis not present

## 2023-02-17 DIAGNOSIS — Z7409 Other reduced mobility: Secondary | ICD-10-CM | POA: Diagnosis not present

## 2023-02-17 DIAGNOSIS — M25671 Stiffness of right ankle, not elsewhere classified: Secondary | ICD-10-CM | POA: Diagnosis not present

## 2023-02-17 DIAGNOSIS — F900 Attention-deficit hyperactivity disorder, predominantly inattentive type: Secondary | ICD-10-CM | POA: Diagnosis not present

## 2023-02-19 ENCOUNTER — Ambulatory Visit: Payer: 59 | Admitting: Medical

## 2023-02-19 VITALS — BP 120/80 | HR 80 | Wt 247.2 lb

## 2023-02-19 DIAGNOSIS — Z79899 Other long term (current) drug therapy: Secondary | ICD-10-CM | POA: Diagnosis not present

## 2023-02-19 DIAGNOSIS — R29898 Other symptoms and signs involving the musculoskeletal system: Secondary | ICD-10-CM | POA: Diagnosis not present

## 2023-02-19 DIAGNOSIS — Z6841 Body Mass Index (BMI) 40.0 and over, adult: Secondary | ICD-10-CM | POA: Diagnosis not present

## 2023-02-19 DIAGNOSIS — M25671 Stiffness of right ankle, not elsewhere classified: Secondary | ICD-10-CM | POA: Diagnosis not present

## 2023-02-19 DIAGNOSIS — F909 Attention-deficit hyperactivity disorder, unspecified type: Secondary | ICD-10-CM

## 2023-02-19 DIAGNOSIS — Z124 Encounter for screening for malignant neoplasm of cervix: Secondary | ICD-10-CM | POA: Diagnosis not present

## 2023-02-19 DIAGNOSIS — Z7409 Other reduced mobility: Secondary | ICD-10-CM | POA: Diagnosis not present

## 2023-02-19 DIAGNOSIS — F419 Anxiety disorder, unspecified: Secondary | ICD-10-CM

## 2023-02-19 MED ORDER — METHYLPHENIDATE HCL 10 MG PO TABS: ORAL_TABLET | ORAL | 0 refills | Status: DC

## 2023-02-19 MED ORDER — METHYLPHENIDATE HCL 10 MG PO TABS
ORAL_TABLET | ORAL | 0 refills | Status: DC
Start: 2023-02-19 — End: 2023-07-09

## 2023-02-19 MED ORDER — BUPROPION HCL ER (XL) 150 MG PO TB24: 150.0000 mg | ORAL_TABLET | Freq: Every day | ORAL | 1 refills | Status: DC

## 2023-02-19 NOTE — Progress Notes (Signed)
Subjective:  Aimee Duncan is a 27 y.o. female who presents for Chief Complaint  Patient presents with   Medical Management of Chronic Issues    Med check     Here for med check.   Currently taking Wellbutrin 150mg  XL and Ritalin 10mg , and using 1.5 tablet in the morning, and on weekends takes 1/2 a pill.  Not taking at night.  Currently working at [employer] like this regimen is working well.  Mood is ok.   Still in counseling . Doing therapy once weekly.   Sees Dia Crawford, [facility].    Lost aunt recently, so been mourning.  Lives with paternal grandmother  In the interim she broke her right ankle in July, after falling down stairs.  Had surgery, still in PT now.  Fell at a park.  No other aggravating or relieving factors.    No other c/o.  Past Medical History:  Diagnosis Date   ADD (attention deficit disorder)    Need for influenza vaccination 01/05/2019   Needs flu shot 02/05/2021   Obesity    Premature birth    born to mother with eclampsia, born [redacted] wk gestation   Current Outpatient Medications on File Prior to Visit  Medication Sig Dispense Refill   fexofenadine (ALLEGRA ALLERGY) 180 MG tablet Take 1 tablet (180 mg total) by mouth daily. 90 tablet 3   hydrocortisone valerate ointment (WESTCORT) 0.2 % Apply 1 application topically 2 (two) times daily. (Patient not taking: Reported on 02/19/2023) 60 g 1   No current facility-administered medications on file prior to visit.    The following portions of the patient's history were reviewed and updated as appropriate: allergies, current medications, past family history, past medical history, past social history, past surgical history and problem list.  ROS Otherwise as in subjective above    Objective: BP 120/80   Pulse 80   Wt 247 lb 3.2 oz (112.1 kg)   BMI 41.78 kg/m   General appearance: alert, no distress, well developed, well nourished Psych: pleasant, good eye contact, answers  questions appropriately    Assessment: Encounter Diagnoses  Name Primary?   Attention deficit hyperactivity disorder (ADHD), unspecified ADHD type Yes   Anxiety    High risk medication use    Screening for cervical cancer    BMI 40.0-44.9, adult (HCC)      Plan: ADD-doing fine on current therapy.  Continue methylphenidate 1 and 1/2 tablets in the morning during the week and 1/2 tablet on the weekend in the morning  Anxiety-doing fine on Wellbutrin.  Continue counseling.  Glad that she is continue with that  Referral to gynecology for Pap smear  BMI greater than 40-counseled on diet, exercise, possible eating program such as whole 30, weight watchers or [place].  Counseled on exercise.  Discussed possible medications such as Zepbound, Wegovy, Contrave or Qsymia.  She will check insurance coverage for this.  We also discussed weight management clinic as an option.  Cruzita was seen today for medical management of chronic issues.  Diagnoses and all orders for this visit:  Attention deficit hyperactivity disorder (ADHD), unspecified ADHD type  Anxiety  High risk medication use  Screening for cervical cancer -     Ambulatory referral to Gynecology  BMI 40.0-44.9, adult (HCC)  Other orders -     buPROPion (WELLBUTRIN XL) 150 MG 24 hr tablet; Take 1 tablet (150 mg total) by mouth daily. -  methylphenidate (RITALIN) 10 MG tablet; 1-1.5 tablet daily in am -     methylphenidate (RITALIN) 10 MG tablet; 1-1.5 tablet daily in am -     methylphenidate (RITALIN) 10 MG tablet; 1-1.5 tablet daily in am   Follow up: April 2025 for well visit

## 2023-02-19 NOTE — Patient Instructions (Signed)
Sample plan  Breakfast You may eat 1 of the following Omelette, which can include a small amount of cheese, and vegetables such as peppers, mushrooms, small pieces of [place] or chicken Low sugar yogurt serving which can include some fruit such as berries Egg whites or hard boiled egg and meat (1-2 strips of bacon, or small piece of [place] sausage or [place] bacon)   Lunch A protein source such as 1 of the following: 1 serving of beans such as black beans, pinto beans, green beans, or edamame (soy beans) 1 meat serving such as 6 oz or deck of card size serving of fish, skinless chicken, or [place], either grilled or baked preferably.   You can use some pork or beef, but limit this compared to fish, chicken or [place] Vegetable - Half of your plate should be a non-starchy vegetables!  So avoid white potatoes and corn.  Otherwise, eat a large portion of vegetables. Avocado, cucumber, tomato, carrots, greens, lettuce, squash, okra, etc.  Vegetables can include salad with olive oil/vinaigrette dressing   Mid-afternoon snack 1 fruit serving such as one of the following: medium-sized apple medium-sized orange, Tangerine 1/2 banana  3/4 cup of fresh berries or frozen berries  A protein source such as one of the following: 8 almonds  small handful of walnuts or other nuts small piece of cheese, low sugar yogurt   Dinner A protein source such as 1 of the following: 1 serving of beans such as black beans, pinto beans, green beans, or edamame (soy beans) 1 meat serving such as 6 oz or deck of card size serving of fish, skinless chicken, or [place], either grilled or baked preferably.   You can use some pork or beef, but limit this compared to fish, chicken or [place] Vegetable - Half of your plate should be a non-starchy vegetables!  So avoid white potatoes and corn.  Otherwise, eat a large portion of vegetables. Avocado, cucumber, tomato, carrots, greens, lettuce, squash, okra, etc.   Vegetables can include salad with olive oil/vinaigrette dressing   Beverages: Water Unsweet tea Home made juice with a juicer without sugar added other than small bit of honey or agave nectar Water with sugar free flavor such as [place]   AVOID.... For the time being I want you to cut out the following items completely: Soda, sweet tea, juice, beer or wine or alcohol ALL grains and breads including rice, pasta, bread, cereal Sweets such as cake, candy, pies, chips, cookies, chocolate

## 2023-02-24 DIAGNOSIS — F900 Attention-deficit hyperactivity disorder, predominantly inattentive type: Secondary | ICD-10-CM | POA: Diagnosis not present

## 2023-03-01 DIAGNOSIS — F900 Attention-deficit hyperactivity disorder, predominantly inattentive type: Secondary | ICD-10-CM | POA: Diagnosis not present

## 2023-03-09 DIAGNOSIS — M25671 Stiffness of right ankle, not elsewhere classified: Secondary | ICD-10-CM | POA: Diagnosis not present

## 2023-03-09 DIAGNOSIS — Z7409 Other reduced mobility: Secondary | ICD-10-CM | POA: Diagnosis not present

## 2023-03-09 DIAGNOSIS — R29898 Other symptoms and signs involving the musculoskeletal system: Secondary | ICD-10-CM | POA: Diagnosis not present

## 2023-03-17 DIAGNOSIS — F900 Attention-deficit hyperactivity disorder, predominantly inattentive type: Secondary | ICD-10-CM | POA: Diagnosis not present

## 2023-03-24 DIAGNOSIS — F900 Attention-deficit hyperactivity disorder, predominantly inattentive type: Secondary | ICD-10-CM | POA: Diagnosis not present

## 2023-03-30 DIAGNOSIS — F900 Attention-deficit hyperactivity disorder, predominantly inattentive type: Secondary | ICD-10-CM | POA: Diagnosis not present

## 2023-04-01 DIAGNOSIS — H66001 Acute suppurative otitis media without spontaneous rupture of ear drum, right ear: Secondary | ICD-10-CM | POA: Diagnosis not present

## 2023-04-04 DIAGNOSIS — H6501 Acute serous otitis media, right ear: Secondary | ICD-10-CM | POA: Diagnosis not present

## 2023-04-05 DIAGNOSIS — F900 Attention-deficit hyperactivity disorder, predominantly inattentive type: Secondary | ICD-10-CM | POA: Diagnosis not present

## 2023-04-09 ENCOUNTER — Telehealth: Payer: 59 | Admitting: Medical

## 2023-04-09 ENCOUNTER — Encounter: Payer: Self-pay | Admitting: Medical

## 2023-04-09 VITALS — Ht 64.0 in | Wt 240.0 lb

## 2023-04-09 DIAGNOSIS — R490 Dysphonia: Secondary | ICD-10-CM

## 2023-04-09 DIAGNOSIS — H6690 Otitis media, unspecified, unspecified ear: Secondary | ICD-10-CM | POA: Diagnosis not present

## 2023-04-09 DIAGNOSIS — R0981 Nasal congestion: Secondary | ICD-10-CM | POA: Diagnosis not present

## 2023-04-09 MED ORDER — FLUTICASONE PROPIONATE 50 MCG/ACT NA SUSP: 2.0000 | Freq: Every day | NASAL | 0 refills | Status: DC

## 2023-04-09 MED ORDER — PREDNISONE 20 MG PO TABS: ORAL_TABLET | ORAL | 0 refills | Status: DC

## 2023-04-09 MED ORDER — PSEUDOEPHEDRINE HCL 60 MG PO TABS: 60.0000 mg | ORAL_TABLET | Freq: Two times a day (BID) | ORAL | 0 refills | Status: DC

## 2023-04-09 NOTE — Progress Notes (Signed)
 Subjective:     Patient ID: Aimee Duncan, female   DOB: 05-Jul-1995, 28 y.o.   MRN: [id number]  This visit type was conducted due to national recommendations for restrictions regarding the COVID-19 Pandemic (e.g. social distancing) in an effort to limit this patient's exposure and mitigate transmission in our community.  Due to their co-morbid illnesses, this patient is at least at moderate risk for complications without adequate follow up.  This format is felt to be most appropriate for this patient at this time.    Documentation for virtual audio and video telecommunications through [place] encounter:  The patient was located at home. The provider was located in the office. The patient did consent to this visit and is aware of possible charges through their insurance for this visit.  The other persons participating in this telemedicine service were none. Time spent on call was 20 minutes and in review of previous records 20 minutes total.  This virtual service is not related to other E/M service within previous 7 days.   HPI Chief Complaint  Patient presents with   Sore Throat    Phlegm and scratchiness. Since yesterday. Had an ear infection and is on the last day of antibiotics.    The day after Christmas had went to [facility] ED, diagnosed with ear infection and sinus infection.   Was given antibiotic Amoxicillin .  Went back on Sunday 5 days ago for same, was given allergy medicaiton, allegra .   Still has ongoing symptoms.  This morning scratchy voice, hoarse.   Ear pain has resolved.  Headache and sinus pressure has resolved.  Has intermittent mild cough.  At night some stuffiness in nose.   No new fever, body aches or chills.   Did covid test day before Christmas and it was negative.  No other aggravating or relieving factors. No other complaint.  Past Medical History:  Diagnosis Date   ADD (attention deficit disorder)    Need for influenza vaccination 01/05/2019   Needs  flu shot 02/05/2021   Obesity    Premature birth    born to mother with eclampsia, born 102 wk gestation   Current Outpatient Medications on File Prior to Visit  Medication Sig Dispense Refill   buPROPion  (WELLBUTRIN  XL) 150 MG 24 hr tablet Take 1 tablet (150 mg total) by mouth daily. 90 tablet 1   fexofenadine  (ALLEGRA  ALLERGY) 180 MG tablet Take 1 tablet (180 mg total) by mouth daily. 90 tablet 3   methylphenidate  (RITALIN ) 10 MG tablet 1-1.5 tablet daily in am 60 tablet 0   methylphenidate  (RITALIN ) 10 MG tablet 1-1.5 tablet daily in am 60 tablet 0   [START ON 04/21/2023] methylphenidate  (RITALIN ) 10 MG tablet 1-1.5 tablet daily in am 60 tablet 0   hydrocortisone  valerate ointment (WESTCORT ) 0.2 % Apply 1 application topically 2 (two) times daily. (Patient not taking: Reported on 04/09/2023) 60 g 1   No current facility-administered medications on file prior to visit.     Review of Systems As in subjective    Objective:   Physical Exam Due to coronavirus pandemic stay at home measures, patient visit was virtual and they were not examined in person.   Ht 5' 4 (1.626 m) Comment: patient reported  Wt 240 lb (108.9 kg) Comment: patient reported  LMP 04/08/2023   BMI 41.20 kg/m   BP Readings from Last 3 Encounters:  02/19/23 120/80  07/20/22 130/68  10/21/21 120/70    Gen: wd, wn, nad No obvious hoarseness or  congested sounding     Assessment:     Encounter Diagnoses  Name Primary?   Acute otitis media, unspecified otitis media type Yes   Head congestion    Hoarse        Plan:      I reviewed her urgent care/emergency department notes through care everywhere  At this point antibiotic did its job.  She completed a course of amoxicillin   Begin either Sudafed and/or Flonase  together, good hydration, consider nasal saline salt water flush in the nostrils.  If not improving over the next 3 to 4 days can add the 3-day short prednisone  course  Call back next week if not  much improved or follow-up in person if not improved  Aimee Duncan was seen today for sore throat.  Diagnoses and all orders for this visit:  Acute otitis media, unspecified otitis media type  Head congestion  Hoarse  Other orders -     predniSONE  (DELTASONE ) 20 MG tablet; 3 tablets today, 2 tablets tomorrow, 1 tablet the third day -     pseudoephedrine  (SUDAFED) 60 MG tablet; Take 1 tablet (60 mg total) by mouth in the morning and at bedtime. -     fluticasone  (FLONASE ) 50 MCG/ACT nasal spray; Place 2 sprays into both nostrils daily.  F/u prn

## 2023-04-15 DIAGNOSIS — S82841D Displaced bimalleolar fracture of right lower leg, subsequent encounter for closed fracture with routine healing: Secondary | ICD-10-CM | POA: Diagnosis not present

## 2023-04-21 DIAGNOSIS — F900 Attention-deficit hyperactivity disorder, predominantly inattentive type: Secondary | ICD-10-CM | POA: Diagnosis not present

## 2023-04-26 DIAGNOSIS — F900 Attention-deficit hyperactivity disorder, predominantly inattentive type: Secondary | ICD-10-CM | POA: Diagnosis not present

## 2023-05-01 ENCOUNTER — Other Ambulatory Visit: Payer: Self-pay | Admitting: Medical

## 2023-05-05 DIAGNOSIS — F900 Attention-deficit hyperactivity disorder, predominantly inattentive type: Secondary | ICD-10-CM | POA: Diagnosis not present

## 2023-05-24 DIAGNOSIS — F900 Attention-deficit hyperactivity disorder, predominantly inattentive type: Secondary | ICD-10-CM | POA: Diagnosis not present

## 2023-06-09 DIAGNOSIS — F900 Attention-deficit hyperactivity disorder, predominantly inattentive type: Secondary | ICD-10-CM | POA: Diagnosis not present

## 2023-06-16 DIAGNOSIS — F900 Attention-deficit hyperactivity disorder, predominantly inattentive type: Secondary | ICD-10-CM | POA: Diagnosis not present

## 2023-06-21 DIAGNOSIS — F900 Attention-deficit hyperactivity disorder, predominantly inattentive type: Secondary | ICD-10-CM | POA: Diagnosis not present

## 2023-06-30 DIAGNOSIS — F900 Attention-deficit hyperactivity disorder, predominantly inattentive type: Secondary | ICD-10-CM | POA: Diagnosis not present

## 2023-07-09 ENCOUNTER — Encounter: Payer: Self-pay | Admitting: Medical

## 2023-07-09 ENCOUNTER — Ambulatory Visit (INDEPENDENT_AMBULATORY_CARE_PROVIDER_SITE_OTHER): Payer: 59 | Admitting: Medical

## 2023-07-09 VITALS — BP 110/76 | HR 87 | Ht 65.0 in | Wt 239.6 lb

## 2023-07-09 DIAGNOSIS — F419 Anxiety disorder, unspecified: Secondary | ICD-10-CM

## 2023-07-09 DIAGNOSIS — Z8781 Personal history of (healed) traumatic fracture: Secondary | ICD-10-CM

## 2023-07-09 DIAGNOSIS — Z79899 Other long term (current) drug therapy: Secondary | ICD-10-CM | POA: Diagnosis not present

## 2023-07-09 DIAGNOSIS — Z0000 Encounter for general adult medical examination without abnormal findings: Secondary | ICD-10-CM

## 2023-07-09 DIAGNOSIS — F909 Attention-deficit hyperactivity disorder, unspecified type: Secondary | ICD-10-CM | POA: Diagnosis not present

## 2023-07-09 MED ORDER — METHYLPHENIDATE HCL 10 MG PO TABS: ORAL_TABLET | ORAL | 0 refills | Status: DC

## 2023-07-09 MED ORDER — BUPROPION HCL ER (XL) 300 MG PO TB24: 300.0000 mg | ORAL_TABLET | Freq: Every day | ORAL | 0 refills | Status: DC

## 2023-07-09 NOTE — Progress Notes (Signed)
 Subjective:   HPI  Aimee Duncan is a 28 y.o. female who presents for Chief Complaint  Patient presents with   Annual Exam    Fasting cpe, discuss wellbutrin. Pap with obgyn on 4/15 with Dr. Kennith Center    Patient Care Team: Coral Soler, Kermit Balo, PA-C as PCP - General (Family Medicine) Rosalyn Gess, MD as Consulting Physician (Obstetrics and Gynecology) Sees dentist Therapist Doran Clay   Concerns: Here for physical.  Doing fine on Ritalin.  She feels like the Wellbutrin may need to be increased.  She is seeing a therapist regularly and they talked about possibly her going up on the dose.  Sometimes she feels like the medicine helps sometimes not.  She sees gynecology April 15  Working full-time as a Associate Professor  Reviewed their medical, surgical, family, social, medication, and allergy history and updated chart as appropriate.  Past Medical History:  Diagnosis Date   ADD (attention deficit disorder)    Anxiety    Need for influenza vaccination 01/05/2019   Needs flu shot 02/05/2021   Obesity    Premature birth    born to mother with eclampsia, born [redacted] wk gestation    Family History  Problem Relation Age of Onset   Diabetes Mother    Hypertension Father    Sleep apnea Father    Sleep apnea Brother    Dementia Maternal Grandmother    Heart disease Maternal Grandfather        died of MI at age 60yo   Hypertension Paternal Grandmother      Current Outpatient Medications:    buPROPion (WELLBUTRIN XL) 300 MG 24 hr tablet, Take 1 tablet (300 mg total) by mouth daily., Disp: 90 tablet, Rfl: 0   fexofenadine (ALLEGRA ALLERGY) 180 MG tablet, Take 1 tablet (180 mg total) by mouth daily., Disp: 90 tablet, Rfl: 3   fluticasone (FLONASE) 50 MCG/ACT nasal spray, SPRAY 2 SPRAYS INTO EACH NOSTRIL EVERY DAY, Disp: 48 mL, Rfl: 0   hydrocortisone valerate ointment (WESTCORT) 0.2 %, Apply 1 application topically 2 (two) times daily., Disp: 60 g, Rfl: 1    methylphenidate (RITALIN) 10 MG tablet, 1-1.5 tablet daily in am, Disp: 60 tablet, Rfl: 0   [START ON 08/09/2023] methylphenidate (RITALIN) 10 MG tablet, 1-1.5 tablet daily in am, Disp: 60 tablet, Rfl: 0   [START ON 09/10/2023] methylphenidate (RITALIN) 10 MG tablet, 1-1.5 tablet daily in am, Disp: 60 tablet, Rfl: 0  Allergies  Allergen Reactions   Adderall [Amphetamine-Dextroamphetamine]     Nausea, crying spells, mood change   Review of Systems  Constitutional:  Negative for chills, fever, malaise/fatigue and weight loss.  HENT:  Negative for congestion, ear pain, hearing loss, sore throat and tinnitus.   Eyes:  Negative for blurred vision, pain and redness.  Respiratory:  Negative for cough, hemoptysis and shortness of breath.   Cardiovascular:  Negative for chest pain, palpitations, orthopnea, claudication and leg swelling.  Gastrointestinal:  Negative for abdominal pain, blood in stool, constipation, diarrhea, nausea and vomiting.  Genitourinary:  Negative for dysuria, flank pain, frequency, hematuria and urgency.  Musculoskeletal:  Negative for falls, joint pain and myalgias.  Skin:  Negative for itching and rash.  Neurological:  Negative for dizziness, tingling, speech change, weakness and headaches.  Endo/Heme/Allergies:  Negative for polydipsia. Does not bruise/bleed easily.  Psychiatric/Behavioral:  Negative for depression and memory loss. The patient is not nervous/anxious and does not have insomnia.  07/09/2023    8:13 AM 07/20/2022    2:11 PM 08/29/2021    9:23 AM 02/05/2021    3:17 PM 08/22/2020   10:18 AM  Depression screen PHQ 2/9  Decreased Interest 3 1 3  0 0  Down, Depressed, Hopeless 3 1 3  0 0  PHQ - 2 Score 6 2 6  0 0  Altered sleeping 3 1 3     Tired, decreased energy 3 0 3    Change in appetite 3 0 3    Feeling bad or failure about yourself  3 1 3     Trouble concentrating 3 1 3     Moving slowly or fidgety/restless 3 0 3    Suicidal thoughts 0 0 3    PHQ-9  Score 24 5 27     Difficult doing work/chores Not difficult at all Somewhat difficult Extremely dIfficult         Objective:  BP 110/76   Pulse 87   Ht 5\' 5"  (1.651 m)   Wt 239 lb 9.6 oz (108.7 kg)   BMI 39.87 kg/m   Wt Readings from Last 3 Encounters:  07/09/23 239 lb 9.6 oz (108.7 kg)  04/09/23 240 lb (108.9 kg)  02/19/23 247 lb 3.2 oz (112.1 kg)    General appearance: alert, no distress, WD/WN, African American female Skin: unremarkable HEENT: normocephalic, conjunctiva/corneas normal, sclerae anicteric, PERRLA, EOMi Neck: supple, no lymphadenopathy, no thyromegaly, no masses, normal ROM, no bruits Chest: non tender, normal shape and expansion Heart: RRR, normal S1, S2, no murmurs Lungs: CTA bilaterally, no wheezes, rhonchi, or rales Abdomen: +bs, soft, non tender, non distended, no masses, no hepatomegaly, no splenomegaly, no bruits Back: non tender, normal ROM, no scoliosis Musculoskeletal: right anterior forearm with tattoo, to left volar wrist, upper extremities non tender, no obvious deformity, normal ROM throughout, lower extremities non tender, no obvious deformity, normal ROM throughout Extremities: no edema, no cyanosis, no clubbing Pulses: 2+ symmetric, upper and lower extremities, normal cap refill Neurological: alert, oriented x 3, CN2-12 intact, strength normal upper extremities and lower extremities, sensation normal throughout, DTRs 2+ throughout, no cerebellar signs, gait normal Psychiatric: normal affect, behavior normal, pleasant  Breast/gyn/rectal - deferred to gynecology     Assessment and Plan :   Encounter Diagnoses  Name Primary?   Encounter for health maintenance examination in adult Yes   Attention deficit hyperactivity disorder (ADHD), unspecified ADHD type    High risk medication use    Anxiety    History of fracture     This visit was a preventative care visit, also known as wellness visit or routine physical.   Topics typically include  healthy lifestyle, diet, exercise, preventative care, vaccinations, sick and well care, proper use of emergency dept and after hours care, as well as other concerns.     Recommendations: Continue to return yearly for your annual wellness and preventative care visits.  This gives [place] a chance to discuss healthy lifestyle, exercise, vaccinations, review your chart record, and perform screenings where appropriate.  I recommend you see your dentist yearly for routine dental care including hygiene visits twice yearly.  See your gynecologist yearly for routine gynecological care.   Vaccination recommendations were reviewed Immunization History  Administered Date(s) Administered   DTaP 05/22/1998, 07/09/1998, 10/31/1998, 12/17/1999, 02/19/2003   HIB (PRP-OMP) 05/22/1998, 07/09/1998, 12/17/1999   HPV Quadrivalent 12/24/2011, 01/08/2012, 11/14/2013   Hepatitis A 08/06/2008, 12/24/2011   Hepatitis B 05/22/1998, 07/09/1998, 10/31/1998   IPV 05/22/1998, 07/09/1998, 04/17/1999, 02/19/2003   Influenza Nasal  01/08/2012   Influenza,inj,Quad PF,6+ Mos 01/05/2019, 02/05/2021   MMR 04/17/1999, 02/19/2003   Meningococcal B, OMV 11/05/2015, 12/16/2015   Meningococcal Conjugate 11/14/2013   PFIZER(Purple Top)SARS-COV-2 Vaccination 06/16/2019, 07/07/2019, 05/30/2020   Pneumococcal Conjugate-13 10/31/1998, 04/17/1999, 12/17/1999   Tdap 10/18/2009, 11/13/2020   Consider yeary flu shot   Screening for cancer: Colon cancer screening: Age 14  Breast cancer screening: You should perform a self breast exam monthly and a clinical breast exam by your gynecologist yearly  Cervical cancer screening: We reviewed recommendations for pap smear screening.  Follow-up with your gynecologist soon as scheduled for Pap smear   Skin cancer screening: Check your skin regularly for new changes, growing lesions, or other lesions of concern Come in for evaluation if you have skin lesions of concern.  Lung cancer  screening: If you have a greater than 20 pack year history of tobacco use, then you may qualify for lung cancer screening with a chest CT scan.   Please call your insurance company to inquire about coverage for this test.  We currently don't have screenings for other cancers besides breast, cervical, colon, and lung cancers.  If you have a strong family history of cancer or have other cancer screening concerns, please let me know.    Bone health: Get at least 150 minutes of aerobic exercise weekly Get weight bearing exercise at least once weekly Bone density test:  A bone density test is an imaging test that uses a type of X-ray to measure the amount of calcium and other minerals in your bones. The test may be used to diagnose or screen you for a condition that causes weak or thin bones (osteoporosis), predict your risk for a broken bone (fracture), or determine how well your osteoporosis treatment is working. The bone density test is recommended for females 65 and older, or females or males <65 if certain risk factors such as thyroid disease, long term use of steroids such as for asthma or rheumatological issues, vitamin D deficiency, estrogen deficiency, family history of osteoporosis, self or family history of fragility fracture in first degree relative.    Heart health: Get at least 150 minutes of aerobic exercise weekly Limit alcohol It is important to maintain a healthy blood pressure and healthy cholesterol numbers  Heart disease screening: Screening for heart disease includes screening for blood pressure, fasting lipids, glucose/diabetes screening, BMI height to weight ratio, reviewed of smoking status, physical activity, and diet.    Goals include blood pressure 120/80 or less, maintaining a healthy lipid/cholesterol profile, preventing diabetes or keeping diabetes numbers under good control, not smoking or using tobacco products, exercising most days per week or at least 150 minutes  per week of exercise, and eating healthy variety of fruits and vegetables, healthy oils, and avoiding unhealthy food choices like fried food, fast food, high sugar and high cholesterol foods.    Other tests may possibly include EKG test, CT coronary calcium score, echocardiogram, exercise treadmill stress test.     Medical care options: I recommend you continue to seek care here first for routine care.  We try really hard to have available appointments Monday through Friday daytime hours for sick visits, acute visits, and physicals.  Urgent care should be used for after hours and weekends for significant issues that cannot wait till the next day.  The emergency department should be used for significant potentially life-threatening emergencies.  The emergency department is expensive, can often have long wait times for less significant concerns, so try  to utilize primary care, urgent care, or telemedicine when possible to avoid unnecessary trips to the emergency department.  Virtual visits and telemedicine have been introduced since the pandemic started in 2020, and can be convenient ways to receive medical care.  We offer virtual appointments as well to assist you in a variety of options to seek medical care.    Separate significant issues discussed: ADD-doing fine on current dosing of methylphenidate  History of anxiety and depressoin in partial remidssion -we discussed her concerns.  Increase to Wellbutrin 300 mg XL daily.  Exercise with aerobic activity several days per week at least 250 minutes/week, get weightbearing exercise at least twice a week particularly given your prior ankle fracture   Aimee Duncan was seen today for annual exam.  Diagnoses and all orders for this visit:  Encounter for health maintenance examination in adult -     Comprehensive metabolic panel with GFR -     CBC -     VITAMIN D 25 Hydroxy (Vit-D Deficiency, Fractures)  Attention deficit hyperactivity disorder (ADHD),  unspecified ADHD type  High risk medication use  Anxiety -     VITAMIN D 25 Hydroxy (Vit-D Deficiency, Fractures)  History of fracture -     VITAMIN D 25 Hydroxy (Vit-D Deficiency, Fractures)  Other orders -     buPROPion (WELLBUTRIN XL) 300 MG 24 hr tablet; Take 1 tablet (300 mg total) by mouth daily. -     methylphenidate (RITALIN) 10 MG tablet; 1-1.5 tablet daily in am -     methylphenidate (RITALIN) 10 MG tablet; 1-1.5 tablet daily in am -     methylphenidate (RITALIN) 10 MG tablet; 1-1.5 tablet daily in am   Follow-up pending labs, yearly for physical

## 2023-07-10 LAB — COMPREHENSIVE METABOLIC PANEL WITH GFR
ALT: 11 IU/L (ref 0–32)
AST: 18 IU/L (ref 0–40)
Albumin: 4.1 g/dL (ref 4.0–5.0)
Alkaline Phosphatase: 111 IU/L (ref 44–121)
BUN/Creatinine Ratio: 10 (ref 9–23)
BUN: 7 mg/dL (ref 6–20)
Bilirubin Total: 0.7 mg/dL (ref 0.0–1.2)
CO2: 21 mmol/L (ref 20–29)
Calcium: 9.5 mg/dL (ref 8.7–10.2)
Chloride: 104 mmol/L (ref 96–106)
Creatinine, Ser: 0.67 mg/dL (ref 0.57–1.00)
Globulin, Total: 3.1 g/dL (ref 1.5–4.5)
Glucose: 93 mg/dL (ref 70–99)
Potassium: 4.3 mmol/L (ref 3.5–5.2)
Sodium: 139 mmol/L (ref 134–144)
Total Protein: 7.2 g/dL (ref 6.0–8.5)
eGFR: 124 mL/min/{1.73_m2} (ref >59)

## 2023-07-10 LAB — CBC
Hematocrit: 39.1 % (ref 34.0–46.6)
Hemoglobin: 13 g/dL (ref 11.1–15.9)
MCH: 30 pg (ref 26.6–33.0)
MCHC: 33.2 g/dL (ref 31.5–35.7)
MCV: 90 fL (ref 79–97)
Platelets: 330 10*3/uL (ref 150–450)
RBC: 4.34 x10E6/uL (ref 3.77–5.28)
RDW: 11.7 % (ref 11.7–15.4)
WBC: 6.4 10*3/uL (ref 3.4–10.8)

## 2023-07-10 LAB — VITAMIN D 25 HYDROXY (VIT D DEFICIENCY, FRACTURES): Vit D, 25-Hydroxy: 24.8 ng/mL — ABNORMAL LOW (ref 30.0–100.0)

## 2023-07-12 ENCOUNTER — Other Ambulatory Visit: Payer: Self-pay | Admitting: Medical

## 2023-07-12 MED ORDER — VITAMIN D (ERGOCALCIFEROL) 1.25 MG (50000 UNIT) PO CAPS: 50000.0000 [IU] | ORAL_CAPSULE | ORAL | 1 refills | Status: DC

## 2023-07-12 NOTE — Progress Notes (Signed)
 Results sent through MyChart

## 2023-07-14 DIAGNOSIS — F900 Attention-deficit hyperactivity disorder, predominantly inattentive type: Secondary | ICD-10-CM | POA: Diagnosis not present

## 2023-07-20 ENCOUNTER — Ambulatory Visit (INDEPENDENT_AMBULATORY_CARE_PROVIDER_SITE_OTHER): Admitting: Obstetrics and Gynecology

## 2023-07-20 ENCOUNTER — Other Ambulatory Visit (HOSPITAL_COMMUNITY)
Admission: RE | Admit: 2023-07-20 | Discharge: 2023-07-20 | Disposition: A | Discharge status: Home / Self Care | Source: Ambulatory Visit | Attending: Obstetrics and Gynecology | Admitting: Obstetrics and Gynecology

## 2023-07-20 ENCOUNTER — Encounter: Payer: Self-pay | Admitting: Obstetrics and Gynecology

## 2023-07-20 VITALS — BP 124/72 | HR 82 | Temp 98.2°F | Ht 64.0 in | Wt 234.0 lb

## 2023-07-20 DIAGNOSIS — Z124 Encounter for screening for malignant neoplasm of cervix: Secondary | ICD-10-CM | POA: Insufficient documentation

## 2023-07-20 DIAGNOSIS — N926 Irregular menstruation, unspecified: Secondary | ICD-10-CM | POA: Diagnosis not present

## 2023-07-20 DIAGNOSIS — Z01419 Encounter for gynecological examination (general) (routine) without abnormal findings: Secondary | ICD-10-CM | POA: Diagnosis not present

## 2023-07-20 DIAGNOSIS — N946 Dysmenorrhea, unspecified: Secondary | ICD-10-CM | POA: Diagnosis not present

## 2023-07-20 DIAGNOSIS — Z3009 Encounter for other general counseling and advice on contraception: Secondary | ICD-10-CM | POA: Diagnosis not present

## 2023-07-20 DIAGNOSIS — Z113 Encounter for screening for infections with a predominantly sexual mode of transmission: Secondary | ICD-10-CM

## 2023-07-20 DIAGNOSIS — Z6841 Body Mass Index (BMI) 40.0 and over, adult: Secondary | ICD-10-CM | POA: Diagnosis not present

## 2023-07-20 DIAGNOSIS — A5901 Trichomonal vulvovaginitis: Secondary | ICD-10-CM

## 2023-07-20 DIAGNOSIS — Z1331 Encounter for screening for depression: Secondary | ICD-10-CM | POA: Diagnosis not present

## 2023-07-20 NOTE — Assessment & Plan Note (Signed)
 Encouraged healthy diet and exercise.

## 2023-07-20 NOTE — Assessment & Plan Note (Signed)
 Moderate sx, well controlled with pamprin

## 2023-07-20 NOTE — Patient Instructions (Signed)

## 2023-07-20 NOTE — Progress Notes (Signed)
 28 y.o. G0P0000 female with dysmenorrhea here for annual exam. Single. Lab technician.  Pt here with Mom, taken OCPs in the past and it made her gain 10lb and made menstrual periods worse. Only took OCP for a year for dysmenorrhea. No longer taking OCPs. Skipped menstrual cycle in February. Stopped tracking cycles since 2024, however cycles were q29-51d in 2023.  Patient's last menstrual period was 06/14/2023 (approximate). Period Duration (Days): 7 Period Pattern: Regular Menstrual Flow: Heavy (at the beginning its heavy) Menstrual Control: Maxi pad Dysmenorrhea: (!) Mild Dysmenorrhea Symptoms: Headache Menarche: 28yo  Abnormal bleeding: as noted Pelvic discharge or pain: none Breast mass, nipple discharge or skin changes : none Birth control: not currently active Last PAP: No results found for: "DIAGPAP", "HPVHIGH", "ADEQPAP" Gardasil: completed Sexually active: yes, relationship ended Fall 2024 Exercising: no Smoker: no  Garment/textile technologist Visit from [facility]20/[facility]  PHQ-2 Total Score 4       Flowsheet Row Office Visit from 07/09/2023 in [facility]  PHQ-9 Total Score 24       GYN HISTORY: None  OB History  Gravida Para Term Preterm AB Living  0 0 0 0 0 0  SAB IAB Ectopic Multiple Live Births  0 0 0 0 0    Past Medical History:  Diagnosis Date   ADD (attention deficit disorder)    Anxiety    Need for influenza vaccination 01/05/2019   Needs flu shot 02/05/2021   Obesity    Premature birth    born to mother with eclampsia, born [redacted] wk gestation    Past Surgical History:  Procedure Laterality Date   ANKLE SURGERY Right 2024   ankle fracture from fall   INGUINAL HERNIA REPAIR     left, age 47yo    Current Outpatient Medications on File Prior to Visit  Medication Sig Dispense Refill   buPROPion (WELLBUTRIN XL) 300 MG 24 hr tablet Take 1 tablet (300 mg total) by mouth daily. 90 tablet 0    fexofenadine (ALLEGRA ALLERGY) 180 MG tablet Take 1 tablet (180 mg total) by mouth daily. 90 tablet 3   fluticasone (FLONASE) 50 MCG/ACT nasal spray SPRAY 2 SPRAYS INTO EACH NOSTRIL EVERY DAY 48 mL 0   hydrocortisone valerate ointment (WESTCORT) 0.2 % Apply 1 application topically 2 (two) times daily. 60 g 1   methylphenidate (RITALIN) 10 MG tablet 1-1.5 tablet daily in am 60 tablet 0   [START ON 08/09/2023] methylphenidate (RITALIN) 10 MG tablet 1-1.5 tablet daily in am 60 tablet 0   Vitamin D, Ergocalciferol, (DRISDOL) 1.25 MG (50000 UNIT) CAPS capsule Take 1 capsule (50,000 Units total) by mouth every 7 (seven) days. 12 capsule 1   No current facility-administered medications on file prior to visit.    Social History   Socioeconomic History   Marital status: Single    Spouse name: Not on file   Number of children: Not on file   Years of education: Not on file   Highest education level: Not on file  Occupational History   Not on file  Tobacco Use   Smoking status: Never   Smokeless tobacco: Never  Substance and Sexual Activity   Alcohol use: Yes    Comment: occasionally   Drug use: No   Sexual activity: Yes    Birth control/protection: Condom  Other Topics Concern   Not on file  Social History Narrative   Working at [employer], Engineer, mining.  Was in marching band in high school.  Went to [employer],  [employer].     Exercise - boxing prior, some exercise at gym. 07/2023   Social Drivers of Health   Financial Resource Strain: Low Risk  (07/09/2023)   Overall Financial Resource Strain (CARDIA)    Difficulty of Paying Living Expenses: Not hard at all  Food Insecurity: No Food Insecurity (07/09/2023)   Hunger Vital Sign    Worried About Running Out of Food in the Last Year: Never true    Ran Out of Food in the Last Year: Never true  Transportation Needs: No Transportation Needs (07/09/2023)   PRAPARE - Administrator, Civil Service (Medical): No    Lack of  Transportation (Non-Medical): No  Physical Activity: Insufficiently Active (07/09/2023)   Exercise Vital Sign    Days of Exercise per Week: 2 days    Minutes of Exercise per Session: 10 min  Stress: No Stress Concern Present (07/09/2023)   [employer] of Occupational Health - Occupational Stress Questionnaire    Feeling of Stress : Only a little  Social Connections: Unknown (07/09/2023)   Social Connection and Isolation Panel [NHANES]    Frequency of Communication with Friends and Family: Three times a week    Frequency of Social Gatherings with Friends and Family: Three times a week    Attends Religious Services: 1 to 4 times per year    Active Member of Clubs or Organizations: No    Attends Banker Meetings: Never    Marital Status: Not on file  Intimate Partner Violence: Not At Risk (07/09/2023)   Humiliation, Afraid, Rape, and Kick questionnaire    Fear of Current or Ex-Partner: No    Emotionally Abused: No    Physically Abused: No    Sexually Abused: No    Family History  Problem Relation Age of Onset   Endometriosis Mother    Diabetes Mother    Hypertension Father    Sleep apnea Father    Sleep apnea Brother    Dementia Maternal Grandmother    Heart disease Maternal Grandfather        died of MI at age 46yo   Hypertension Paternal Grandmother     Allergies  Allergen Reactions   Hydrocodone-Acetaminophen Other (See Comments)    Dosage was 5-325 mg. Makes patient sleepy.   Adderall [Amphetamine-Dextroamphetamine]     Nausea, crying spells, mood change   Strawberry Extract       PE Today's Vitals   07/20/23 1415  BP: 124/72  Pulse: 82  Temp: 98.2 F (36.8 C)  TempSrc: Oral  SpO2: 98%  Weight: 234 lb (106.1 kg)  Height: 5\' 4"  (1.626 m)   Body mass index is 40.17 kg/m.  Physical Exam Vitals reviewed. Exam conducted with a chaperone present.  Constitutional:      General: She is not in acute distress.    Appearance: Normal appearance.   HENT:     Head: Normocephalic and atraumatic.     Nose: Nose normal.  Eyes:     Extraocular Movements: Extraocular movements intact.     Conjunctiva/sclera: Conjunctivae normal.  Neck:     Thyroid: No thyroid mass, thyromegaly or thyroid tenderness.  Pulmonary:     Effort: Pulmonary effort is normal.  Chest:     Chest wall: No mass or tenderness.  Breasts:    Right: Normal. No swelling, mass, nipple discharge, skin change or tenderness.     Left: Normal. No  swelling, mass, nipple discharge, skin change or tenderness.  Abdominal:     General: There is no distension.     Palpations: Abdomen is soft.     Tenderness: There is no abdominal tenderness.  Genitourinary:    General: Normal vulva.     Exam position: Lithotomy position.     Urethra: No prolapse.     Vagina: Normal. No vaginal discharge or bleeding.     Cervix: Normal. No lesion.     Uterus: Normal. Not enlarged and not tender.      Adnexa: Right adnexa normal and left adnexa normal.  Musculoskeletal:        General: Normal range of motion.     Cervical back: Normal range of motion.  Lymphadenopathy:     Upper Body:     Right upper body: No axillary adenopathy.     Left upper body: No axillary adenopathy.     Lower Body: No right inguinal adenopathy. No left inguinal adenopathy.  Skin:    General: Skin is warm and dry.  Neurological:     General: No focal deficit present.     Mental Status: She is alert.  Psychiatric:        Mood and Affect: Mood normal.        Behavior: Behavior normal.      Assessment and Plan:        Well woman exam with routine gynecological exam Assessment & Plan: Cervical cancer screening performed according to ASCCP guidelines. Labs and immunizations with her primary Encouraged safe sexual practices as indicated Encouraged healthy lifestyle practices with diet and exercise For patients under 50yo, I recommend 1000mg  calcium daily and 600IU of vitamin D daily.    Cervical cancer  screening -     Cytology - PAP  Screen for STD (sexually transmitted disease) -     Cytology - PAP  Encounter for counseling regarding contraception  BMI 40.0-44.9, adult [facility]) Assessment & Plan: Encouraged healthy diet and exercise.   Dysmenorrhea Assessment & Plan: Moderate sx, well controlled with pamprin   Irregular periods Assessment & Plan: Discussed 5-10% weight loss has been shown to improve cycle regularity. Encouraged lifestyle changes for weight loss Also discussed hormonal therapy for cycle regulation, however patient declines at this time. Discussed risk of endometrial hyperplasia and malignancy with AUB-O.  Reviewed importance of q41month cycle.  RTO in 6 months for f/u period tracking and weight management    Hx of anxiety and depression managed by PCP  Romaine Closs, MD

## 2023-07-20 NOTE — Assessment & Plan Note (Signed)
 Cervical cancer screening performed according to ASCCP guidelines. Labs and immunizations with her primary Encouraged safe sexual practices as indicated Encouraged healthy lifestyle practices with diet and exercise For patients under 26yo, I recommend 1000mg  calcium daily and 600IU of vitamin D daily.

## 2023-07-20 NOTE — Assessment & Plan Note (Signed)
 Discussed 5-10% weight loss has been shown to improve cycle regularity. Encouraged lifestyle changes for weight loss Also discussed hormonal therapy for cycle regulation, however patient declines at this time. Discussed risk of endometrial hyperplasia and malignancy with AUB-O.  Reviewed importance of q9month cycle.  RTO in 6 months for f/u period tracking and weight management

## 2023-07-21 DIAGNOSIS — F900 Attention-deficit hyperactivity disorder, predominantly inattentive type: Secondary | ICD-10-CM | POA: Diagnosis not present

## 2023-07-22 ENCOUNTER — Encounter: Payer: 59 | Admitting: Medical

## 2023-07-23 LAB — CYTOLOGY - PAP
Chlamydia: NEGATIVE
Comment: NEGATIVE
Comment: NEGATIVE
Comment: NEGATIVE
Comment: NORMAL
Diagnosis: NEGATIVE
High risk HPV: NEGATIVE
Neisseria Gonorrhea: NEGATIVE
Trichomonas: POSITIVE — AB

## 2023-07-26 ENCOUNTER — Encounter: Payer: Self-pay | Admitting: Obstetrics and Gynecology

## 2023-07-26 MED ORDER — METRONIDAZOLE 500 MG PO TABS: 500.0000 mg | ORAL_TABLET | Freq: Two times a day (BID) | ORAL | 0 refills | Status: AC

## 2023-07-26 NOTE — Addendum Note (Signed)
 Addended by: Cleone Dad V on: 07/26/2023 12:10 PM   Modules accepted: Orders

## 2023-07-27 NOTE — Telephone Encounter (Signed)
See result note. Encounter closed.

## 2023-07-28 DIAGNOSIS — F900 Attention-deficit hyperactivity disorder, predominantly inattentive type: Secondary | ICD-10-CM | POA: Diagnosis not present

## 2023-07-28 NOTE — Telephone Encounter (Signed)
 Spoke with Olivia at [facility].  Verbal order: Flagyl  500 mg tab; take 2g (4 tab) PO once; Dispense #4/0RF  Read back and confirmed.

## 2023-08-20 ENCOUNTER — Ambulatory Visit (INDEPENDENT_AMBULATORY_CARE_PROVIDER_SITE_OTHER): Admitting: Medical

## 2023-08-20 VITALS — BP 112/84 | HR 79 | Wt 235.6 lb

## 2023-08-20 DIAGNOSIS — F419 Anxiety disorder, unspecified: Secondary | ICD-10-CM

## 2023-08-20 DIAGNOSIS — E559 Vitamin D deficiency, unspecified: Secondary | ICD-10-CM

## 2023-08-20 DIAGNOSIS — F909 Attention-deficit hyperactivity disorder, unspecified type: Secondary | ICD-10-CM

## 2023-08-20 DIAGNOSIS — R4589 Other symptoms and signs involving emotional state: Secondary | ICD-10-CM | POA: Diagnosis not present

## 2023-08-20 MED ORDER — VITAMIN D (ERGOCALCIFEROL) 1.25 MG (50000 UNIT) PO CAPS: 50000.0000 [IU] | ORAL_CAPSULE | ORAL | 1 refills | Status: DC

## 2023-08-20 MED ORDER — METHYLPHENIDATE HCL 10 MG PO TABS: 10.0000 mg | ORAL_TABLET | Freq: Two times a day (BID) | ORAL | 0 refills | Status: DC

## 2023-08-20 NOTE — Progress Notes (Signed)
 Subjective:  Aimee Duncan is a 28 y.o. female who presents for Chief Complaint  Patient presents with   Medical Management of Chronic Issues    Follow-up on ritalin  and wellbutrin .      Here for med check, med management.    ADHD-Uses ritalin  typically 1 or 1.5 tablets in morning, and typically does 1/2 tablet daily through weekends.  Working [employer], logs in water and soil samples, lab work.    History of anxiety and depressed mood-compliant with Wellbutrin  XL 300 mg daily.  Seeing counseling once weekly.  Sees Aimee Duncan, [facility]  Exercising some.  She realizes she needs to be more consistent with this.  Vitamin D  deficiency-compliant vitamin D  but only got 1 months worth at the pharmacy.  No other aggravating or relieving factors.    No other c/o.  Past Medical History:  Diagnosis Date   ADD (attention deficit disorder)    Anxiety    Need for influenza vaccination 01/05/2019   Needs flu shot 02/05/2021   Obesity    Premature birth    born to mother with eclampsia, born [redacted] wk gestation   Current Outpatient Medications on File Prior to Visit  Medication Sig Dispense Refill   buPROPion  (WELLBUTRIN  XL) 300 MG 24 hr tablet Take 1 tablet (300 mg total) by mouth daily. 90 tablet 0   fluticasone  (FLONASE ) 50 MCG/ACT nasal spray SPRAY 2 SPRAYS INTO EACH NOSTRIL EVERY DAY 48 mL 0   hydrocortisone  valerate ointment (WESTCORT ) 0.2 % Apply 1 application topically 2 (two) times daily. 60 g 1   No current facility-administered medications on file prior to visit.     The following portions of the patient's history were reviewed and updated as appropriate: allergies, current medications, past family history, past medical history, past social history, past surgical history and problem list.  ROS Otherwise as in subjective above    Objective: BP 112/84   Pulse 79   Wt 235 lb 9.6 oz (106.9 kg)   SpO2 98%   BMI 40.44 kg/m   General appearance: alert, no  distress, well developed, well nourished Psych: pleasant, good eye contact, answers questions appropriately    Assessment: Encounter Diagnoses  Name Primary?   Attention deficit hyperactivity disorder (ADHD), unspecified ADHD type Yes   Anxiety    Depressed mood    Vitamin D  deficiency      Plan: ADHD-work on current therapy.  Using methylphenidate  Ritalin  10 mg, 1 to 1.5 tablets on weekdays, typically 1/2 tablet daily on the weekends.  History of anxiety depression-doing fine on current therapy, Wellbutrin  XL 300 mg daily  Vitamin D  deficiency on April 2025 labs-continue supplement, 50,000 units weekly.  Return in 2 months for labs with nurse visit   [name] was seen today for medical management of chronic issues.  Diagnoses and all orders for this visit:  Attention deficit hyperactivity disorder (ADHD), unspecified ADHD type  Anxiety  Depressed mood  Vitamin D  deficiency -     Vitamin D , 25-hydroxy; Future -     Basic metabolic panel with GFR; Future  Other orders -     Vitamin D , Ergocalciferol , (DRISDOL ) 1.25 MG (50000 UNIT) CAPS capsule; Take 1 capsule (50,000 Units total) by mouth every 7 (seven) days. -     methylphenidate  (RITALIN ) 10 MG tablet; Take 1 tablet (10 mg total) by mouth 2 (two) times daily with breakfast and lunch.    Follow up: 2 months for lab visit

## 2023-08-27 ENCOUNTER — Telehealth: Payer: Self-pay | Admitting: Medical

## 2023-08-27 NOTE — Telephone Encounter (Signed)
 Copied from CRM [id number]. Topic: Clinical - Medication Question >> Aug 27, 2023  9:29 AM Everette C wrote: Reason for CRM: The patient has called for confirmation of the quantity of their Vitamin D , Ergocalciferol , (DRISDOL ) 1.25 MG (50000 UNIT) CAPS capsule [[id number]] prescription   The patient is uncertain if they're supposed to receive a 30 day supply or 3 month supply

## 2023-08-30 DIAGNOSIS — F900 Attention-deficit hyperactivity disorder, predominantly inattentive type: Secondary | ICD-10-CM | POA: Diagnosis not present

## 2023-09-15 ENCOUNTER — Telehealth: Payer: Self-pay | Admitting: Internal Medicine

## 2023-09-15 DIAGNOSIS — F900 Attention-deficit hyperactivity disorder, predominantly inattentive type: Secondary | ICD-10-CM | POA: Diagnosis not present

## 2023-09-15 MED ORDER — VITAMIN D (ERGOCALCIFEROL) 1.25 MG (50000 UNIT) PO CAPS: 50000.0000 [IU] | ORAL_CAPSULE | ORAL | 5 refills | Status: DC

## 2023-09-15 NOTE — Telephone Encounter (Signed)
 I went ahead and refilled vitamin d  for 30 days at a time versus 90 days. Sent in # 4 with 5 refills

## 2023-09-15 NOTE — Telephone Encounter (Signed)
 Copied from CRM [id number]. Topic: Clinical - Medication Question >> Sep 15, 2023  8:51 AM Aimee Duncan T wrote: Reason for CRM: patient called stated her insurance will only cover a 30 day supply of Vitamin D , Ergocalciferol , (DRISDOL ) 1.25 MG (50000 UNIT) CAPS capsule. Patient picked up 30 days but is not sure if she will be able to go back in 30 days to get another refill. Please f/u with pharmacy

## 2023-09-20 DIAGNOSIS — F900 Attention-deficit hyperactivity disorder, predominantly inattentive type: Secondary | ICD-10-CM | POA: Diagnosis not present

## 2023-09-27 DIAGNOSIS — F900 Attention-deficit hyperactivity disorder, predominantly inattentive type: Secondary | ICD-10-CM | POA: Diagnosis not present

## 2023-10-03 ENCOUNTER — Other Ambulatory Visit: Payer: Self-pay | Admitting: Medical

## 2023-10-20 ENCOUNTER — Other Ambulatory Visit

## 2023-10-20 DIAGNOSIS — E559 Vitamin D deficiency, unspecified: Secondary | ICD-10-CM | POA: Diagnosis not present

## 2023-10-21 ENCOUNTER — Other Ambulatory Visit: Payer: Self-pay | Admitting: Internal Medicine

## 2023-10-21 ENCOUNTER — Ambulatory Visit: Payer: Self-pay | Admitting: Medical

## 2023-10-21 DIAGNOSIS — E559 Vitamin D deficiency, unspecified: Secondary | ICD-10-CM

## 2023-10-21 LAB — BASIC METABOLIC PANEL WITH GFR
BUN/Creatinine Ratio: 10 (ref 9–23)
BUN: 8 mg/dL (ref 6–20)
CO2: 18 mmol/L — ABNORMAL LOW (ref 20–29)
Calcium: 9.3 mg/dL (ref 8.7–10.2)
Chloride: 104 mmol/L (ref 96–106)
Creatinine, Ser: 0.8 mg/dL (ref 0.57–1.00)
Glucose: 87 mg/dL (ref 70–99)
Potassium: 4.3 mmol/L (ref 3.5–5.2)
Sodium: 139 mmol/L (ref 134–144)
eGFR: 105 mL/min/1.73 (ref >59)

## 2023-10-21 LAB — VITAMIN D 25 HYDROXY (VIT D DEFICIENCY, FRACTURES): Vit D, 25-Hydroxy: 34.7 ng/mL (ref 30.0–100.0)

## 2023-10-21 MED ORDER — VITAMIN D (ERGOCALCIFEROL) 1.25 MG (50000 UNIT) PO CAPS: 50000.0000 [IU] | ORAL_CAPSULE | ORAL | 5 refills | Status: DC

## 2023-10-21 NOTE — Progress Notes (Signed)
 Results sent through MyChart

## 2023-10-25 ENCOUNTER — Encounter: Payer: Self-pay | Admitting: Obstetrics and Gynecology

## 2023-10-25 ENCOUNTER — Ambulatory Visit (INDEPENDENT_AMBULATORY_CARE_PROVIDER_SITE_OTHER): Admitting: Obstetrics and Gynecology

## 2023-10-25 VITALS — BP 98/62 | HR 72 | Temp 98.2°F | Wt 231.0 lb

## 2023-10-25 DIAGNOSIS — Z113 Encounter for screening for infections with a predominantly sexual mode of transmission: Secondary | ICD-10-CM

## 2023-10-25 NOTE — Progress Notes (Signed)
 28 y.o. G59P0000 female with dysmenorrhea here for TOC for trichomonas. Single.  Patient's last menstrual period was 10/17/2023 (approximate). Period Duration (Days): 6 Period Pattern: Regular Menstrual Flow: Moderate Menstrual Control: Maxi pad Dysmenorrhea: (!) Moderate Dysmenorrhea Symptoms: Headache  She reports doing well.  She denies any vaginal itching, irritation, abnormal discharge.  No longer seeing prior partner. Has started going to the gym.  Cycles are less crampy. She has an irregular period since April.  Birth control: None Sexually active: Not currently   GYN HISTORY: No significant history  OB History  Gravida Para Term Preterm AB Living  0 0 0 0 0 0  SAB IAB Ectopic Multiple Live Births  0 0 0 0 0   Past Medical History:  Diagnosis Date   ADD (attention deficit disorder)    Anxiety    Need for influenza vaccination 01/05/2019   Needs flu shot 02/05/2021   Obesity    Premature birth    born to mother with eclampsia, born [redacted] wk gestation   Past Surgical History:  Procedure Laterality Date   ANKLE SURGERY Right 2024   ankle fracture from fall   INGUINAL HERNIA REPAIR     left, age 74yo   Current Outpatient Medications on File Prior to Visit  Medication Sig Dispense Refill   buPROPion  (WELLBUTRIN  XL) 300 MG 24 hr tablet TAKE 1 TABLET BY MOUTH EVERY DAY 30 tablet 1   fluticasone  (FLONASE ) 50 MCG/ACT nasal spray SPRAY 2 SPRAYS INTO EACH NOSTRIL EVERY DAY 48 mL 0   hydrocortisone  valerate ointment (WESTCORT ) 0.2 % Apply 1 application topically 2 (two) times daily. 60 g 1   methylphenidate  (RITALIN ) 10 MG tablet Take 1 tablet (10 mg total) by mouth 2 (two) times daily with breakfast and lunch. 60 tablet 0   Probiotic Product (PROBIOTIC PO) Take by mouth daily.     Vitamin D , Ergocalciferol , (DRISDOL ) 1.25 MG (50000 UNIT) CAPS capsule Take 1 capsule (50,000 Units total) by mouth every 7 (seven) days. 4 capsule 5   No current facility-administered  medications on file prior to visit.   Allergies  Allergen Reactions   Hydrocodone-Acetaminophen Other (See Comments)    Dosage was 5-325 mg. Makes patient sleepy.   Adderall [Amphetamine -Dextroamphetamine]     Nausea, crying spells, mood change   Strawberry Extract       PE Today's Vitals   10/25/23 0845  BP: 98/62  Pulse: 72  Temp: 98.2 F (36.8 C)  TempSrc: Oral  SpO2: 99%  Weight: 231 lb (104.8 kg)   Body mass index is 39.65 kg/m.  Physical Exam Vitals reviewed. Exam conducted with a chaperone present.  Constitutional:      General: She is not in acute distress.    Appearance: Normal appearance.  HENT:     Head: Normocephalic and atraumatic.     Nose: Nose normal.  Eyes:     Extraocular Movements: Extraocular movements intact.     Conjunctiva/sclera: Conjunctivae normal.  Pulmonary:     Effort: Pulmonary effort is normal.  Genitourinary:    General: Normal vulva.     Exam position: Lithotomy position.     Vagina: Normal. No vaginal discharge.     Cervix: Normal. No cervical motion tenderness, discharge or lesion.     Uterus: Normal. Not enlarged and not tender.      Adnexa: Right adnexa normal and left adnexa normal.  Musculoskeletal:        General: Normal range of motion.  Cervical back: Normal range of motion.  Neurological:     General: No focal deficit present.     Mental Status: She is alert.  Psychiatric:        Mood and Affect: Mood normal.        Behavior: Behavior normal.       Assessment and Plan:        Screen for STD (sexually transmitted disease) -     SURESWAB CT/NG/T. vaginalis  Follow-up as scheduled in October 2024.  Continue tracking cycles and working out regularly.  Vera LULLA Pa, MD

## 2023-10-26 LAB — SURESWAB CT/NG/T. VAGINALIS
C. trachomatis RNA, TMA: NOT DETECTED
N. gonorrhoeae RNA, TMA: NOT DETECTED
Trichomonas vaginalis RNA: NOT DETECTED

## 2023-10-27 ENCOUNTER — Ambulatory Visit: Payer: Self-pay | Admitting: Obstetrics and Gynecology

## 2023-11-03 DIAGNOSIS — F900 Attention-deficit hyperactivity disorder, predominantly inattentive type: Secondary | ICD-10-CM | POA: Diagnosis not present

## 2023-11-17 DIAGNOSIS — F900 Attention-deficit hyperactivity disorder, predominantly inattentive type: Secondary | ICD-10-CM | POA: Diagnosis not present

## 2023-11-26 ENCOUNTER — Other Ambulatory Visit: Payer: Self-pay | Admitting: Medical

## 2023-11-26 DIAGNOSIS — E559 Vitamin D deficiency, unspecified: Secondary | ICD-10-CM

## 2023-11-26 MED ORDER — BUPROPION HCL ER (XL) 300 MG PO TB24: 300.0000 mg | ORAL_TABLET | Freq: Every day | ORAL | 1 refills | Status: DC

## 2023-11-26 MED ORDER — VITAMIN D (ERGOCALCIFEROL) 1.25 MG (50000 UNIT) PO CAPS: 50000.0000 [IU] | ORAL_CAPSULE | ORAL | 2 refills | Status: DC

## 2023-11-26 NOTE — Telephone Encounter (Signed)
 Copied from CRM [id number]. Topic: Clinical - Medication Refill >> Nov 26, 2023  9:53 AM Graeme ORN wrote: Medication:  buPROPion  (WELLBUTRIN  XL) 300 MG 24 hr tablet Vitamin D , Ergocalciferol , (DRISDOL ) 1.25 MG (50000 UNIT) CAPS capsule  Has the patient contacted their pharmacy? No (Agent: If no, request that the patient contact the pharmacy for the refill. If patient does not wish to contact the pharmacy document the reason why and proceed with request.) (Agent: If yes, when and what did the pharmacy advise?) Missed pick up date unable to speak with pharmacy - automated system  This is the patient's preferred pharmacy:  [id number] - [place], [place] - [address] AT [facility] [address] [place] [address] Phone: [phone] Fax: [phone]  Is this the correct pharmacy for this prescription? Yes If no, delete pharmacy and type the correct one.   Has the prescription been filled recently? No  Is the patient out of the medication? Yes  Has the patient been seen for an appointment in the last year OR does the patient have an upcoming appointment? Yes  Can we respond through MyChart? Yes  Agent: Please be advised that Rx refills may take up to 3 business days. We ask that you follow-up with your pharmacy.

## 2023-12-03 ENCOUNTER — Telehealth: Payer: Self-pay | Admitting: Internal Medicine

## 2023-12-03 NOTE — Telephone Encounter (Signed)
 Copied from CRM [id number]. Topic: Clinical - Medical Advice >> Dec 03, 2023 10:03 AM Carlatta H wrote: Reason for CRM: The patient had a ESA letter written by PCP last year and she has misplaced it//She would like to know if she can get another once written//Please call to advise

## 2023-12-07 ENCOUNTER — Encounter: Payer: Self-pay | Admitting: Medical

## 2023-12-07 ENCOUNTER — Other Ambulatory Visit: Payer: Self-pay | Admitting: Internal Medicine

## 2023-12-07 NOTE — Telephone Encounter (Signed)
 Printed and placed in folder for you to review

## 2023-12-07 NOTE — Telephone Encounter (Signed)
 Pt will come by and pick up form if she can not get it off mychart

## 2023-12-20 DIAGNOSIS — F900 Attention-deficit hyperactivity disorder, predominantly inattentive type: Secondary | ICD-10-CM | POA: Diagnosis not present

## 2023-12-27 DIAGNOSIS — F900 Attention-deficit hyperactivity disorder, predominantly inattentive type: Secondary | ICD-10-CM | POA: Diagnosis not present

## 2024-01-06 ENCOUNTER — Other Ambulatory Visit: Payer: Self-pay | Admitting: Medical

## 2024-01-06 MED ORDER — METHYLPHENIDATE HCL 10 MG PO TABS: 10.0000 mg | ORAL_TABLET | Freq: Two times a day (BID) | ORAL | 0 refills | Status: AC

## 2024-01-06 NOTE — Telephone Encounter (Signed)
 Copied from CRM #[id number]. Topic: Clinical - Medication Refill >> Jan 06, 2024  8:15 AM Darshell M wrote: Medication:  methylphenidate  (RITALIN ) 10 MG tablet   Has the patient contacted their pharmacy? No (Agent: If no, request that the patient contact the pharmacy for the refill. If patient does not wish to contact the pharmacy document the reason why and proceed with request.) (Agent: If yes, when and what did the pharmacy advise?)  This is the patient's preferred pharmacy:  [id number] - [place] - [address] AT [facility] [address]  [place] [address] Phone: [phone] Fax: [phone]  Is this the correct pharmacy for this prescription? No If no, delete pharmacy and type the correct one.   Has the prescription been filled recently? No  Is the patient out of the medication? Yes  Has the patient been seen for an appointment in the last year OR does the patient have an upcoming appointment? Yes  Can we respond through MyChart? Yes  Agent: Please be advised that Rx refills may take up to 3 business days. We ask that you follow-up with your pharmacy.

## 2024-01-06 NOTE — Telephone Encounter (Signed)
 Spoke with patient and Marcelline Georgi does not rx any medication, she is therapist for patient.

## 2024-01-06 NOTE — Telephone Encounter (Signed)
 Is this okay to refill?

## 2024-01-10 DIAGNOSIS — F900 Attention-deficit hyperactivity disorder, predominantly inattentive type: Secondary | ICD-10-CM | POA: Diagnosis not present

## 2024-01-18 ENCOUNTER — Ambulatory Visit: Admitting: Obstetrics and Gynecology

## 2024-01-27 ENCOUNTER — Ambulatory Visit: Admitting: Medical

## 2024-01-27 VITALS — BP 120/80 | HR 86 | Temp 97.4°F | Wt 225.0 lb

## 2024-01-27 DIAGNOSIS — T148XXA Other injury of unspecified body region, initial encounter: Secondary | ICD-10-CM

## 2024-01-27 DIAGNOSIS — R0789 Other chest pain: Secondary | ICD-10-CM

## 2024-01-27 DIAGNOSIS — Z23 Encounter for immunization: Secondary | ICD-10-CM | POA: Diagnosis not present

## 2024-01-27 NOTE — Progress Notes (Signed)
 Name: Aimee Duncan   Date of Visit: 01/27/24   Date of last visit with me: 01/06/2024   CHIEF COMPLAINT:  Chief Complaint  Patient presents with   Acute Visit    Chest discomfort- only had pain when moving arm or lifting something. Has since subsided. Thinks she just pulled something       HPI:  Discussed the use of AI scribe software for clinical note transcription with the patient, who gave verbal consent to proceed.  History of Present Illness  Aimee Duncan is a 28 year old female who presents with left shoulder and chest pain.  She has been experiencing chest pain that began yesterday, primarily located in her left arm, back of the shoulder, and chest area. The pain is not associated with breathing but worsens with lifting her left arm. Initially, she thought it would resolve after a night's sleep, but it worsened upon waking and has since subsided slightly.  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 She has not engaged in any exercise outside of work recently and denies any specific injury or incident that could have caused the pain.  She has not used any treatments such as ice, heat, or medications like ibuprofen  to alleviate the pain. No fever, chills, cough, sneezing, shortness of breath, or palpitations. The pain is primarily noted when moving her arm in certain ways, particularly in the back of the shoulder.  Her current medications include Wellbutrin , Flonase , Ritalin , and vitamin D . She has allergies to hydrocodone, Adderall, and strawberries.  No other aggravating or relieving factors. No other complaint.  Objective: BP 120/80   Pulse 86   Temp (!) 97.4 F (36.3 C)   Wt 225 lb (102.1 kg)   SpO2 99%   BMI 38.62 kg/m   General appearence: alert, no distress, WD/WN,  Neck: supple, no lymphadenopathy, no thyromegaly, no masses, normal range of motion, nontender Heart: RRR, normal S1, S2, no murmurs Lungs: CTA bilaterally, no  wheezes, rhonchi, or rales Chest wall mild tenderness over the left superior chest wall just below the collarbone but otherwise chest nontender with normal inspiration and expiration, normal limit Left shoulder and arm nontender with normal range of motion, clavicle nontender, shoulder range of motion normal of arms No abnormality of the chest wall or back, upper back nontender Arms neurovascularly intact    Assessment and Plan Encounter Diagnoses  Name Primary?   Chest wall pain Yes   Muscle strain    COVID-19 vaccine administered    Needs flu shot     Acute musculoskeletal strain of the left chest wall and left shoulder likely due to lifting heavy coolers at work. Pain localized to the back of the shoulder, exacerbated by certain movements. Symptoms have mostly subsided.  Differential includes muscle inflammation or strain. - Apply heat to the affected area using a hot towel or rag. -Advised stretching including arms and full body stretching routinely regularly - - Use proper lifting techniques to prevent future strain. - Consider Aleve or ibuprofen  for pain relief for the next few days -Reassured  General Health Maintenance Discussed the importance of regular exercise, including cardiovascular and strength training, to maintain health and prevent injuries. - Encourage regular exercise, including cardiovascular activities like walking, running, or cycling, and strength training at least twice a week.  Counseled on the influenza virus vaccine.   Influenza vaccine given after consent obtained.  Counseled on the covid virus vaccine.   Covid vaccine given  after consent obtained.  Aimee Duncan was seen today for acute visit.  Diagnoses and all orders for this visit:  Chest wall pain  Muscle strain  COVID-19 vaccine administered -     Pfizer Comirnaty Covid-19 Vaccine 56yrs & older  Needs flu shot -     Flu vaccine trivalent PF, 6mos and  older(Flulaval,Afluria,Fluarix,Fluzone)   F/u prn

## 2024-02-21 DIAGNOSIS — F900 Attention-deficit hyperactivity disorder, predominantly inattentive type: Secondary | ICD-10-CM | POA: Diagnosis not present

## 2024-03-01 DIAGNOSIS — F900 Attention-deficit hyperactivity disorder, predominantly inattentive type: Secondary | ICD-10-CM | POA: Diagnosis not present

## 2024-03-17 DIAGNOSIS — F900 Attention-deficit hyperactivity disorder, predominantly inattentive type: Secondary | ICD-10-CM | POA: Diagnosis not present

## 2024-03-28 ENCOUNTER — Other Ambulatory Visit: Payer: Self-pay | Admitting: Medical

## 2024-03-29 DIAGNOSIS — F900 Attention-deficit hyperactivity disorder, predominantly inattentive type: Secondary | ICD-10-CM | POA: Diagnosis not present

## 2024-04-25 ENCOUNTER — Ambulatory Visit: Admitting: Medical

## 2024-04-25 VITALS — BP 122/80 | HR 88 | Wt 230.2 lb

## 2024-04-25 DIAGNOSIS — Z0289 Encounter for other administrative examinations: Secondary | ICD-10-CM | POA: Diagnosis not present

## 2024-04-25 DIAGNOSIS — F909 Attention-deficit hyperactivity disorder, unspecified type: Secondary | ICD-10-CM | POA: Diagnosis not present

## 2024-04-25 DIAGNOSIS — E559 Vitamin D deficiency, unspecified: Secondary | ICD-10-CM

## 2024-04-25 DIAGNOSIS — A599 Trichomoniasis, unspecified: Secondary | ICD-10-CM

## 2024-04-25 DIAGNOSIS — F419 Anxiety disorder, unspecified: Secondary | ICD-10-CM | POA: Diagnosis not present

## 2024-04-25 MED ORDER — VITAMIN D (ERGOCALCIFEROL) 1.25 MG (50000 UNIT) PO CAPS: 50000.0000 [IU] | ORAL_CAPSULE | ORAL | 3 refills | Status: AC

## 2024-04-25 NOTE — Progress Notes (Signed)
 "  Name: Aimee Duncan   Date of Visit: 04/25/24   Date of last visit with me: 03/28/2024   CHIEF COMPLAINT:  Chief Complaint  Patient presents with   Consult    Needs form filled out for testing for pharmacy board test. Needs extended time and read aloud.        HPI:  Discussed the use of AI scribe software for clinical note transcription with the patient, who gave verbal consent to proceed.  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  She is seeking accommodations for her upcoming pharmacy tech licensing exam, which will be held at a test center in [place]. She requests a separate setting, the ability to read aloud, and extended time for the test. She has previously had similar accommodations during her schooling, which were effective.  She is currently taking vitamin D  weekly, which she prefers and is taking regularly. Her last vitamin D  levels were checked in July. She also uses Ritalin  as needed for focus, particularly when she has tasks that require concentration. She has not taken Ritalin  since her class ended in December and still has a sufficient supply as she cuts the tablets into quarters.  In April of the previous year, she had a Pap smear that was positive for trichomonas. She was treated with a special antibiotic at that time but did not have a follow-up appointment due to a cancellation. No symptoms such as discharge or odor currently and she has been taking a probiotic.  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  No other aggravating or relieving factors. No other complaint.  Past Medical History:  Diagnosis Date   ADD (attention deficit disorder)    Anxiety    Need for influenza vaccination 01/05/2019   Needs flu shot 02/05/2021   Obesity    Premature birth    born to mother with eclampsia, born [redacted] wk  gestation   Medications Ordered Prior to Encounter[1]  ROS as in subjective    Objective: BP 122/80   Pulse 88   Wt 230 lb 3.2 oz (104.4 kg)   SpO2 97%   BMI 39.51 kg/m    Gen: wd, wn, nad Otherwise not examined    Assessment: Encounter Diagnoses  Name Primary?   Attention deficit hyperactivity disorder (ADHD), unspecified ADHD type Yes   Anxiety    Encounter for completion of form with patient    Trichomoniasis    Vitamin D  deficiency      Plan:  Attention-deficit hyperactivity disorder -Managed with Ritalin  10mg  BID prn.  Doing fine on current therapy  Form completion -completed form for testing accommodations -wished her well on her exam  Anxiety   -continue Wellbutrin  300mg  XL daily  Vit d deficiency -continue supplement  Hx/o trichomoniasis in recent months on pap -asymptomatic -recheck lab since she completed therapy    Kerianne was seen today for consult.  Diagnoses and all orders for this visit:  Attention deficit hyperactivity disorder (ADHD), unspecified ADHD type  Anxiety  Encounter for completion of form with patient  Trichomoniasis -     Chlamydia/Gonococcus/Trichomonas, NAA  Vitamin D  deficiency  Other orders -     Vitamin D , Ergocalciferol , (DRISDOL ) 1.25 MG (50000 UNIT) CAPS capsule; Take 1 capsule (50,000 Units total) by mouth every 7 (seven) days for 12 doses.  F/u pending lab      [1]  Current Outpatient Medications on File Prior to Visit  Medication Sig Dispense Refill   buPROPion  (WELLBUTRIN  XL) 300 MG 24 hr tablet TAKE 1 TABLET BY MOUTH EVERY DAY 90 tablet 1   fluticasone  (FLONASE ) 50 MCG/ACT nasal spray SPRAY 2 SPRAYS INTO EACH NOSTRIL EVERY DAY 48 mL 0   hydrocortisone  valerate ointment (WESTCORT ) 0.2 % Apply 1 application topically 2 (two) times daily. 60 g 1   methylphenidate  (RITALIN ) 10 MG tablet Take 1 tablet (10 mg total) by mouth 2 (two) times daily with breakfast and lunch. 60 tablet 0   Probiotic  Product (PROBIOTIC PO) Take by mouth daily.     No current facility-administered medications on file prior to visit.   "

## 2024-04-26 ENCOUNTER — Ambulatory Visit: Payer: Self-pay | Admitting: Medical

## 2024-04-26 LAB — CHLAMYDIA/GONOCOCCUS/TRICHOMONAS, NAA
Chlamydia by NAA: NEGATIVE
Gonococcus by NAA: NEGATIVE
Trich vag by NAA: NEGATIVE

## 2024-04-26 NOTE — Progress Notes (Signed)
 Results through MyChart

## 2024-05-05 ENCOUNTER — Ambulatory Visit: Payer: Self-pay | Admitting: Medical

## 2024-05-09 ENCOUNTER — Ambulatory Visit: Payer: Self-pay | Admitting: Medical

## 2024-05-23 ENCOUNTER — Ambulatory Visit: Admitting: Obstetrics and Gynecology

## 2024-07-25 ENCOUNTER — Encounter: Payer: Self-pay | Admitting: Medical
